# Patient Record
Sex: Female | Born: 1985 | Race: White | Hispanic: No | Marital: Married | Smoking: Never smoker
Health system: Southern US, Community
[De-identification: ages and names within clinical notes are randomized; demographics above are authoritative.]

## PROBLEM LIST (undated history)

## (undated) ENCOUNTER — Inpatient Hospital Stay (HOSPITAL_COMMUNITY): Payer: Self-pay

## (undated) DIAGNOSIS — J45909 Unspecified asthma, uncomplicated: Secondary | ICD-10-CM

## (undated) DIAGNOSIS — F53 Postpartum depression: Secondary | ICD-10-CM

## (undated) DIAGNOSIS — F329 Major depressive disorder, single episode, unspecified: Secondary | ICD-10-CM

## (undated) DIAGNOSIS — I10 Essential (primary) hypertension: Secondary | ICD-10-CM

## (undated) DIAGNOSIS — L4050 Arthropathic psoriasis, unspecified: Secondary | ICD-10-CM

## (undated) DIAGNOSIS — E039 Hypothyroidism, unspecified: Secondary | ICD-10-CM

## (undated) DIAGNOSIS — O24419 Gestational diabetes mellitus in pregnancy, unspecified control: Secondary | ICD-10-CM

## (undated) DIAGNOSIS — F419 Anxiety disorder, unspecified: Secondary | ICD-10-CM

## (undated) DIAGNOSIS — E0500 Thyrotoxicosis with diffuse goiter without thyrotoxic crisis or storm: Secondary | ICD-10-CM

## (undated) DIAGNOSIS — O99345 Other mental disorders complicating the puerperium: Secondary | ICD-10-CM

## (undated) HISTORY — DX: Hypothyroidism, unspecified: E03.9

---
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
## 2011-03-17 ENCOUNTER — Ambulatory Visit (INDEPENDENT_AMBULATORY_CARE_PROVIDER_SITE_OTHER): Payer: Commercial Managed Care - PPO

## 2011-03-17 DIAGNOSIS — J019 Acute sinusitis, unspecified: Secondary | ICD-10-CM

## 2011-03-17 DIAGNOSIS — R300 Dysuria: Secondary | ICD-10-CM

## 2011-03-17 DIAGNOSIS — J029 Acute pharyngitis, unspecified: Secondary | ICD-10-CM

## 2011-03-17 DIAGNOSIS — R509 Fever, unspecified: Secondary | ICD-10-CM

## 2011-05-19 ENCOUNTER — Ambulatory Visit (INDEPENDENT_AMBULATORY_CARE_PROVIDER_SITE_OTHER): Payer: BC Managed Care – PPO | Admitting: Physician Assistant

## 2011-05-19 VITALS — BP 127/84 | HR 66 | Temp 98.9°F | Resp 16 | Ht 64.0 in | Wt 121.0 lb

## 2011-05-19 DIAGNOSIS — M542 Cervicalgia: Secondary | ICD-10-CM

## 2011-05-19 NOTE — Progress Notes (Signed)
  Subjective:    Patient ID: [name] [name], female    DOB: 12-24-1985, 26 y.o.   MRN: [id number]  HPI [name] [name] comes in with concern about a discomfort in her right anterior neck that has been present for 1 1/2 weeks now.  It is not very painful but feels like it is her carotid pulsating.  Worried about any cardiac issue as she has a strong FHX.  She states she hasn't done any new activities that would injure her neck. No CP, SOB, dizziness, or HA. Area feels swollen and like someone is "flicking" it for a quick second.  No discomfort for long period of time.  Very episodic. Has not taken anything for this discomfort  Non smoker Nuva Ring just started in February.  Review of Systems    As above Objective:   Physical Exam  Neck: tender along Right SCM.  Pain with resistive rotation to the left. No pulsatile mass noted.  No adenopathy.  No carotid bruit bilaterally.   Chest; CTA bilaterally RRR w/out m/r/g.       Assessment & Plan:  Neck strain and muscle spasm  Reassured this is not cardiac.  Recommend heat to neck and ibuprofen.  Apply heat. Return if no improvement.

## 2014-02-05 LAB — OB RESULTS CONSOLE ABO/RH: RH TYPE: POSITIVE

## 2014-02-05 LAB — OB RESULTS CONSOLE ANTIBODY SCREEN: ANTIBODY SCREEN: NEGATIVE

## 2014-02-05 LAB — OB RESULTS CONSOLE RUBELLA ANTIBODY, IGM: Rubella: IMMUNE

## 2014-02-05 LAB — OB RESULTS CONSOLE GC/CHLAMYDIA
Chlamydia: NEGATIVE
GC PROBE AMP, GENITAL: NEGATIVE

## 2014-02-05 LAB — OB RESULTS CONSOLE HIV ANTIBODY (ROUTINE TESTING): HIV: NONREACTIVE

## 2014-02-05 LAB — OB RESULTS CONSOLE RPR: RPR: NONREACTIVE

## 2014-02-05 LAB — OB RESULTS CONSOLE HEPATITIS B SURFACE ANTIGEN: Hepatitis B Surface Ag: NEGATIVE

## 2014-03-09 DIAGNOSIS — F32A Depression, unspecified: Secondary | ICD-10-CM

## 2014-03-09 DIAGNOSIS — I10 Essential (primary) hypertension: Secondary | ICD-10-CM

## 2014-03-09 HISTORY — DX: Essential (primary) hypertension: I10

## 2014-03-09 HISTORY — DX: Depression, unspecified: F32.A

## 2014-03-09 NOTE — L&D Delivery Note (Signed)
Delivery Note At 9:51 PM a non-viable female was delivered via Vaginal, Spontaneous Delivery (Presentation: ;  ).  APGAR: 0/0, ; weight  .   Placenta status: Intact, Spontaneous.3 vessel  Cord:  with the following complications:ninviable no evidence of cord compication .  Cord pH: na  Anesthesia:   Episiotomy:  nonde Lacerations:  second Suture Repair: 2.0 chromic Est. Blood Loss (mL):    Mom to postpartum.  Baby to Couplet care / Skin to Skin.  Dorinda Stehr S 08/19/2014, 10:02 PM

## 2014-08-19 ENCOUNTER — Inpatient Hospital Stay (HOSPITAL_COMMUNITY): Payer: 59

## 2014-08-19 ENCOUNTER — Encounter (HOSPITAL_COMMUNITY): Payer: Self-pay | Admitting: Anesthesiology

## 2014-08-19 ENCOUNTER — Inpatient Hospital Stay (HOSPITAL_COMMUNITY)
Admission: AD | Admit: 2014-08-19 | Discharge: 2014-08-20 | DRG: 775 | Disposition: A | Discharge status: Home / Self Care | Payer: 59 | Source: Ambulatory Visit | Attending: Obstetrics and Gynecology | Admitting: Obstetrics and Gynecology

## 2014-08-19 ENCOUNTER — Encounter (HOSPITAL_COMMUNITY): Payer: Self-pay | Admitting: *Deleted

## 2014-08-19 DIAGNOSIS — Z8249 Family history of ischemic heart disease and other diseases of the circulatory system: Secondary | ICD-10-CM

## 2014-08-19 DIAGNOSIS — O368190 Decreased fetal movements, unspecified trimester, not applicable or unspecified: Secondary | ICD-10-CM

## 2014-08-19 DIAGNOSIS — Z3A36 36 weeks gestation of pregnancy: Secondary | ICD-10-CM | POA: Diagnosis present

## 2014-08-19 DIAGNOSIS — O368130 Decreased fetal movements, third trimester, not applicable or unspecified: Secondary | ICD-10-CM | POA: Diagnosis present

## 2014-08-19 DIAGNOSIS — O364XX0 Maternal care for intrauterine death, not applicable or unspecified: Secondary | ICD-10-CM | POA: Diagnosis present

## 2014-08-19 DIAGNOSIS — IMO0002 Reserved for concepts with insufficient information to code with codable children: Secondary | ICD-10-CM | POA: Diagnosis present

## 2014-08-19 HISTORY — DX: Unspecified asthma, uncomplicated: J45.909

## 2014-08-19 LAB — CBC
HCT: 36.2 % (ref 36.0–46.0)
HEMATOCRIT: 36.8 % (ref 36.0–46.0)
HEMOGLOBIN: 12.2 g/dL (ref 12.0–15.0)
Hemoglobin: 12 g/dL (ref 12.0–15.0)
MCH: 29.4 pg (ref 26.0–34.0)
MCH: 29.6 pg (ref 26.0–34.0)
MCHC: 33.2 g/dL (ref 30.0–36.0)
MCHC: 33.1 g/dL (ref 30.0–36.0)
MCV: 88.7 fL (ref 78.0–100.0)
MCV: 89.2 fL (ref 78.0–100.0)
PLATELETS: 207 10*3/uL (ref 150–400)
Platelets: 207 10*3/uL (ref 150–400)
RBC: 4.15 MIL/uL (ref 3.87–5.11)
RBC: 4.06 MIL/uL (ref 3.87–5.11)
RDW: 14.7 % (ref 11.5–15.5)
RDW: 15.2 % (ref 11.5–15.5)
WBC: 9.8 10*3/uL (ref 4.0–10.5)
WBC: 15.2 10*3/uL — AB (ref 4.0–10.5)

## 2014-08-19 LAB — COMPREHENSIVE METABOLIC PANEL
ALT: 18 U/L (ref 14–54)
AST: 29 U/L (ref 15–41)
Albumin: 2.6 g/dL — ABNORMAL LOW (ref 3.5–5.0)
Alkaline Phosphatase: 240 U/L — ABNORMAL HIGH (ref 38–126)
Anion gap: 6 (ref 5–15)
BUN: 8 mg/dL (ref 6–20)
CO2: 22 mmol/L (ref 22–32)
Calcium: 8.5 mg/dL — ABNORMAL LOW (ref 8.9–10.3)
Chloride: 106 mmol/L (ref 101–111)
Creatinine, Ser: 0.7 mg/dL (ref 0.44–1.00)
GFR calc Af Amer: >60 mL/min (ref >60)
GFR calc non Af Amer: >60 mL/min (ref >60)
GLUCOSE: 106 mg/dL — AB (ref 65–99)
Potassium: 3.8 mmol/L (ref 3.5–5.1)
Sodium: 134 mmol/L — ABNORMAL LOW (ref 135–145)
Total Bilirubin: 0.5 mg/dL (ref 0.3–1.2)
Total Protein: 5.8 g/dL — ABNORMAL LOW (ref 6.5–8.1)

## 2014-08-19 LAB — TYPE AND SCREEN
ABO/RH(D): O POS
ANTIBODY SCREEN: NEGATIVE

## 2014-08-19 LAB — ABO/RH: ABO/RH(D): O POS

## 2014-08-19 LAB — RPR: RPR: NONREACTIVE

## 2014-08-19 MED ORDER — ACETAMINOPHEN 325 MG PO TABS
650.0000 mg | ORAL_TABLET | ORAL | Status: DC | PRN
  Administered 2014-08-19: 650 mg via ORAL
  Filled 2014-08-19: qty 2

## 2014-08-19 MED ORDER — FLEET ENEMA 7-19 GM/118ML RE ENEM: 1.0000 | ENEMA | RECTAL | Status: DC | PRN

## 2014-08-19 MED ORDER — OXYTOCIN BOLUS FROM INFUSION: 500.0000 mL | INTRAVENOUS | Status: DC

## 2014-08-19 MED ORDER — CITRIC ACID-SODIUM CITRATE 334-500 MG/5ML PO SOLN: 30.0000 mL | ORAL | Status: DC | PRN

## 2014-08-19 MED ORDER — OXYTOCIN 40 UNITS IN LACTATED RINGERS INFUSION - SIMPLE MED
62.5000 mL/h | INTRAVENOUS | Status: DC
Start: 2014-08-19 — End: 2014-08-20
  Administered 2014-08-19: 62.5 mL/h via INTRAVENOUS
  Filled 2014-08-19: qty 1000

## 2014-08-19 MED ORDER — LIDOCAINE HCL (PF) 1 % IJ SOLN
30.0000 mL | INTRAMUSCULAR | Status: DC | PRN
  Administered 2014-08-19: 30 mL via SUBCUTANEOUS
  Filled 2014-08-19: qty 30

## 2014-08-19 MED ORDER — LABETALOL HCL 5 MG/ML IV SOLN
10.0000 mg | Freq: Once | INTRAVENOUS | Status: AC
  Administered 2014-08-19: 10 mg via INTRAVENOUS

## 2014-08-19 MED ORDER — MISOPROSTOL 50MCG HALF TABLET
50.0000 ug | ORAL_TABLET | ORAL | Status: DC | PRN
  Administered 2014-08-19: 50 ug via ORAL
  Filled 2014-08-19 (×2): qty 0.5

## 2014-08-19 MED ORDER — FENTANYL CITRATE (PF) 100 MCG/2ML IJ SOLN
100.0000 ug | INTRAMUSCULAR | Status: DC | PRN
  Administered 2014-08-19 (×3): 100 ug via INTRAVENOUS
  Filled 2014-08-19 (×3): qty 2

## 2014-08-19 MED ORDER — EPHEDRINE 5 MG/ML INJ: 10.0000 mg | INTRAVENOUS | Status: DC | PRN

## 2014-08-19 MED ORDER — OXYCODONE-ACETAMINOPHEN 5-325 MG PO TABS
1.0000 | ORAL_TABLET | ORAL | Status: DC | PRN
Start: 2014-08-19 — End: 2014-08-20

## 2014-08-19 MED ORDER — FENTANYL 2.5 MCG/ML BUPIVACAINE 1/10 % EPIDURAL INFUSION (WH - ANES)
INTRAMUSCULAR | Status: AC
  Filled 2014-08-19: qty 125

## 2014-08-19 MED ORDER — LABETALOL HCL 5 MG/ML IV SOLN
INTRAVENOUS | Status: AC
  Administered 2014-08-19: 10 mg via INTRAVENOUS
  Filled 2014-08-19: qty 4

## 2014-08-19 MED ORDER — LACTATED RINGERS IV SOLN
INTRAVENOUS | Status: DC
  Administered 2014-08-19 (×2): via INTRAVENOUS

## 2014-08-19 MED ORDER — MISOPROSTOL 25 MCG QUARTER TABLET
25.0000 ug | ORAL_TABLET | ORAL | Status: DC | PRN
  Administered 2014-08-19: 25 ug via VAGINAL
  Filled 2014-08-19: qty 0.25

## 2014-08-19 MED ORDER — TERBUTALINE SULFATE 1 MG/ML IJ SOLN: 0.2500 mg | Freq: Once | INTRAMUSCULAR | Status: AC | PRN

## 2014-08-19 MED ORDER — LORAZEPAM 2 MG/ML IJ SOLN: 1.0000 mg | INTRAMUSCULAR | Status: DC | PRN

## 2014-08-19 MED ORDER — DIPHENHYDRAMINE HCL 50 MG/ML IJ SOLN: 12.5000 mg | INTRAMUSCULAR | Status: DC | PRN

## 2014-08-19 MED ORDER — PHENYLEPHRINE 40 MCG/ML (10ML) SYRINGE FOR IV PUSH (FOR BLOOD PRESSURE SUPPORT): 80.0000 ug | PREFILLED_SYRINGE | INTRAVENOUS | Status: DC | PRN

## 2014-08-19 MED ORDER — LACTATED RINGERS IV SOLN
500.0000 mL | INTRAVENOUS | Status: DC | PRN
  Administered 2014-08-19: 500 mL via INTRAVENOUS

## 2014-08-19 MED ORDER — MISOPROSTOL 50MCG HALF TABLET
50.0000 ug | ORAL_TABLET | ORAL | Status: DC | PRN
  Administered 2014-08-19 (×2): 50 ug via ORAL
  Filled 2014-08-19 (×2): qty 0.5

## 2014-08-19 MED ORDER — HYDRALAZINE HCL 20 MG/ML IJ SOLN
10.0000 mg | INTRAMUSCULAR | Status: DC | PRN
Start: 2014-08-19 — End: 2014-08-20
  Administered 2014-08-19 (×2): 10 mg via INTRAVENOUS
  Filled 2014-08-19 (×2): qty 1

## 2014-08-19 MED ORDER — PHENYLEPHRINE 40 MCG/ML (10ML) SYRINGE FOR IV PUSH (FOR BLOOD PRESSURE SUPPORT)
PREFILLED_SYRINGE | INTRAVENOUS | Status: AC
  Filled 2014-08-19: qty 20

## 2014-08-19 MED ORDER — ONDANSETRON HCL 4 MG/2ML IJ SOLN
4.0000 mg | Freq: Four times a day (QID) | INTRAMUSCULAR | Status: DC | PRN
  Administered 2014-08-19: 4 mg via INTRAVENOUS
  Filled 2014-08-19: qty 2

## 2014-08-19 MED ORDER — FENTANYL 2.5 MCG/ML BUPIVACAINE 1/10 % EPIDURAL INFUSION (WH - ANES): 14.0000 mL/h | INTRAMUSCULAR | Status: DC | PRN

## 2014-08-19 MED ORDER — OXYCODONE-ACETAMINOPHEN 5-325 MG PO TABS: 2.0000 | ORAL_TABLET | ORAL | Status: DC | PRN

## 2014-08-19 NOTE — Progress Notes (Signed)
Hot and  Flushed after IV labetolol dose given. VSS. Fan given for comfort

## 2014-08-19 NOTE — MAU Note (Signed)
Report called to [facility] in [facility].

## 2014-08-19 NOTE — Plan of Care (Signed)
Problem: Phase II Progression Outcomes Goal: Initiate breastfeeding within 1hr delivery Outcome: Not Applicable Date Met:  03/47/42 IUFD

## 2014-08-19 NOTE — Plan of Care (Signed)
Problem: Phase II Progression Outcomes Goal: Fetal monitoring per orders Outcome: Not Applicable Date Met:  94/70/76 IUFD

## 2014-08-19 NOTE — MAU Note (Signed)
Decreased FM on Saturday. Denies LOF or bleeding.

## 2014-08-19 NOTE — Progress Notes (Signed)
Patient ID: [name] [name], female   DOB: Oct 05, 1985, 29 y.o.   MRN: [id number] Cervix 4 cm arom clear continue cytotec

## 2014-08-19 NOTE — H&P (Signed)
[name] [name] is a 29 y.o. female presenting at 36 weeks and 4 days with decreased fetal movement. It'll heart tones were not audible. Subsequent ultrasound confirmed intrauterine fetal demise. To be admitted for Cytotec induction. Maternal Medical History:  Prenatal complications: no prenatal complications Prenatal Complications - Diabetes: none.    OB History    Gravida Para Term Preterm AB TAB SAB Ectopic Multiple Living   1              Past Medical History  Diagnosis Date  . Medical history non-contributory    Past Surgical History  Procedure Laterality Date  . No past surgeries    . Wisdom tooth extraction     Family History: family history includes Cancer in her maternal grandmother; Heart disease in her maternal grandmother. Social History:  reports that she has never smoked. She does not have any smokeless tobacco history on file. She reports that she does not drink alcohol or use illicit drugs.   Prenatal Transfer Tool  Maternal Diabetes: No Genetic Screening: Normal Maternal Ultrasounds/Referrals: Normal Fetal Ultrasounds or other Referrals:  None Maternal Substance Abuse:  No Significant Maternal Medications:  None Significant Maternal Lab Results:  None Other Comments:  None  ROS    Blood pressure 140/94, pulse 74, temperature 97.7 F (36.5 C), resp. rate 20, height 5\' 3"  (1.6 m), weight 71.396 kg (157 lb 6.4 oz). Maternal Exam:  Abdomen: Patient reports no abdominal tenderness. Fundal height is c/w dates.       Physical Exam  Constitutional: She is oriented to person, place, and time.  Cardiovascular: Normal rate, regular rhythm and normal heart sounds.   Respiratory: Effort normal and breath sounds normal.  Neurological: She is alert and oriented to person, place, and time. She has normal reflexes.    Prenatal labs: ABO, Rh:   Antibody:   Rubella:   RPR:    HBsAg:    HIV:    GBS:     Assessment/Plan: Intrauterine fetal demise at 90 weeks  and 4 days. Patient be brought in for Cytotec induction and delivery. Questions were answered.   Christophere Hillhouse S 08/19/2014, 3:10 AM

## 2014-08-19 NOTE — MAU Note (Signed)
Ledell Noss PA in to see pt.

## 2014-08-19 NOTE — MAU Provider Note (Signed)
  History     CSN: [id number]  Arrival date and time: 08/19/14 0130   First Provider Initiated Contact with Patient 08/19/14 [phone]      Chief Complaint  Patient presents with  . Decreased Fetal Movement   HPI Jayelle B Myszka 29 y.o. G1P0 @[redacted]w[redacted]d  presents with decreased fetal movement.  2 days ago it was slightly decreased.  Yesterday, more significantly decreased. No movement noted in the last 8 hours.  Baby usually moves more at bedtime but that has happened today.  Pt has eaten and no irregular activity.  Was seen in clinic earlier this week, 6/7, had BPP that was 8/8.   Denies LOF, VB, CP, SOB.  Feeling very anxious and upset.   OB History    Gravida Para Term Preterm AB TAB SAB Ectopic Multiple Living   1               Past Medical History  Diagnosis Date  . Medical history non-contributory     Past Surgical History  Procedure Laterality Date  . No past surgeries    . Wisdom tooth extraction      Family History  Problem Relation Age of Onset  . Cancer Maternal Grandmother   . Heart disease Maternal Grandmother     History  Substance Use Topics  . Smoking status: Never Smoker   . Smokeless tobacco: Not on file  . Alcohol Use: No    Allergies: No Known Allergies  Prescriptions prior to admission  Medication Sig Dispense Refill Last Dose  . Prenatal Vit-Fe Fumarate-FA (PRENATAL MULTIVITAMIN) TABS tablet Take 1 tablet by mouth daily at 12 noon.   08/18/2014 at Unknown time  . etonogestrel-ethinyl estradiol (NUVARING) 0.12-0.015 MG/24HR vaginal ring Place 1 each vaginally every 28 (twenty-eight) days. Insert vaginally and leave in place for 3 consecutive weeks, then remove for 1 week.       ROS Pertinent ROS in HPI.  All other systems are negative.   Physical Exam   Blood pressure 140/94, pulse 74, temperature 97.7 F (36.5 C), resp. rate 20, height 5\' 3"  (1.6 m), weight 157 lb 6.4 oz (71.396 kg).  Physical Exam  Constitutional: She is oriented to person,  place, and time. She appears well-developed and well-nourished.  HENT:  Head: Normocephalic and atraumatic.  Eyes: EOM are normal.  Neck: Normal range of motion.  Cardiovascular: Normal rate.   Respiratory: Effort normal. No respiratory distress.  GI: Soft. She exhibits no distension. There is no tenderness.  Musculoskeletal: Normal range of motion.  Neurological: She is alert and oriented to person, place, and time.  Skin: Skin is warm and dry.  Psychiatric: She has a normal mood and affect.    MAU Course  Procedures  MDM Bedside u/s ordered upon hearing of nurse unable to get heart tones.  Brief assessment performed.  Dr. Radene Knee informed nurse unable to get heart tones, Bedside U/S ordered.   U/S tech indicates no cardiac activity.  Dr. Radene Knee informed of this prelim result.  He indicates he will be down to see pt and assume care.    Assessment and Plan    Teague Bobbye Morton 08/19/2014, 2:19 AM

## 2014-08-19 NOTE — MAU Note (Signed)
Allie Dimmer PA on unit. Aware unable to hear FHTs . Will see pt

## 2014-08-19 NOTE — Progress Notes (Signed)
Bedside u/s to evaluate fetal cardiac activity

## 2014-08-19 NOTE — Progress Notes (Signed)
Dr Radene Knee on phone - aware that cytotec not given - uc's 1-2 min apart - also let him know that fentanyl iv was given for pain - stated that he was home and to give him a few minutes to get here

## 2014-08-19 NOTE — Progress Notes (Signed)
[name] [name] was lying in bed, visibly upset but appropriately tearful. Her husband, mother, and another relative were bedside. She said she is doing all she can (to deal with what is going on). She was very tearful; and during points of our visit her husband became tearful as well. Other family members were appropriately tearful with her. We talked about the support she is fortunate to have during this difficult day. Pt showed me gifts for baby from her shower; a blanket and dolls that she was holding close by with monograms. The name given to her daughter is Rory Percy.  She asked me to pray for her; her family joined bedside during prayer.  We talked briefly about how or if she may want Chaplain support throughout the day and after the birth of her child. Will refer [name] to daytime Chaplain for follow-up. Please page if any additional support is needed during the day or night. Ernest Haber Chaplain   08/19/14 1015  Clinical Encounter Type  Visited With Patient and family together  Visit Type Initial;Other (Comment)  Referral From Nurse  Consult/Referral To Chaplain

## 2014-08-19 NOTE — Plan of Care (Signed)
Problem: Phase I Progression Outcomes Goal: FHR checked 5 minutes after meds (ROM) Rupture of Membranes Outcome: Not Applicable Date Met:  70/65/82 IUFD

## 2014-08-19 NOTE — Progress Notes (Signed)
Family with pt and pt's spouse and very supportive

## 2014-08-20 DIAGNOSIS — IMO0002 Reserved for concepts with insufficient information to code with codable children: Secondary | ICD-10-CM | POA: Diagnosis present

## 2014-08-20 LAB — CBC
HCT: 32.8 % — ABNORMAL LOW (ref 36.0–46.0)
HEMOGLOBIN: 10.7 g/dL — AB (ref 12.0–15.0)
MCH: 29.3 pg (ref 26.0–34.0)
MCHC: 32.6 g/dL (ref 30.0–36.0)
MCV: 89.9 fL (ref 78.0–100.0)
Platelets: 192 10*3/uL (ref 150–400)
RBC: 3.65 MIL/uL — ABNORMAL LOW (ref 3.87–5.11)
RDW: 15.4 % (ref 11.5–15.5)
WBC: 16 10*3/uL — ABNORMAL HIGH (ref 4.0–10.5)

## 2014-08-20 MED ORDER — PRENATAL MULTIVITAMIN CH: 1.0000 | ORAL_TABLET | Freq: Every day | ORAL | Status: DC

## 2014-08-20 MED ORDER — ONDANSETRON HCL 4 MG PO TABS: 4.0000 mg | ORAL_TABLET | ORAL | Status: DC | PRN

## 2014-08-20 MED ORDER — ACETAMINOPHEN 325 MG PO TABS: 650.0000 mg | ORAL_TABLET | ORAL | Status: DC | PRN

## 2014-08-20 MED ORDER — WITCH HAZEL-GLYCERIN EX PADS: 1.0000 "application " | MEDICATED_PAD | CUTANEOUS | Status: DC | PRN

## 2014-08-20 MED ORDER — BISACODYL 10 MG RE SUPP
10.0000 mg | Freq: Every day | RECTAL | Status: DC | PRN
  Filled 2014-08-20: qty 1

## 2014-08-20 MED ORDER — SIMETHICONE 80 MG PO CHEW: 80.0000 mg | CHEWABLE_TABLET | ORAL | Status: DC | PRN

## 2014-08-20 MED ORDER — DIPHENHYDRAMINE HCL 25 MG PO CAPS: 25.0000 mg | ORAL_CAPSULE | Freq: Four times a day (QID) | ORAL | Status: DC | PRN

## 2014-08-20 MED ORDER — ZOLPIDEM TARTRATE 5 MG PO TABS: 5.0000 mg | ORAL_TABLET | Freq: Every evening | ORAL | Status: DC | PRN

## 2014-08-20 MED ORDER — DOCUSATE SODIUM 100 MG PO CAPS: 100.0000 mg | ORAL_CAPSULE | Freq: Two times a day (BID) | ORAL | Status: DC

## 2014-08-20 MED ORDER — SERTRALINE HCL 25 MG PO TABS: 25.0000 mg | ORAL_TABLET | Freq: Every day | ORAL | Status: DC

## 2014-08-20 MED ORDER — LANOLIN HYDROUS EX OINT: TOPICAL_OINTMENT | CUTANEOUS | Status: DC | PRN

## 2014-08-20 MED ORDER — FLEET ENEMA 7-19 GM/118ML RE ENEM: 1.0000 | ENEMA | Freq: Every day | RECTAL | Status: DC | PRN

## 2014-08-20 MED ORDER — OXYCODONE-ACETAMINOPHEN 5-325 MG PO TABS
1.0000 | ORAL_TABLET | ORAL | Status: DC | PRN
Start: 2014-08-20 — End: 2014-08-20

## 2014-08-20 MED ORDER — SENNOSIDES-DOCUSATE SODIUM 8.6-50 MG PO TABS: 2.0000 | ORAL_TABLET | ORAL | Status: DC

## 2014-08-20 MED ORDER — DIBUCAINE 1 % RE OINT
1.0000 "application " | TOPICAL_OINTMENT | RECTAL | Status: DC | PRN
  Filled 2014-08-20: qty 28

## 2014-08-20 MED ORDER — IBUPROFEN 600 MG PO TABS: 600.0000 mg | ORAL_TABLET | Freq: Four times a day (QID) | ORAL | Status: DC

## 2014-08-20 MED ORDER — BENZOCAINE-MENTHOL 20-0.5 % EX AERO
1.0000 "application " | INHALATION_SPRAY | CUTANEOUS | Status: DC | PRN
  Administered 2014-08-20: 1 via TOPICAL
  Filled 2014-08-20 (×2): qty 56

## 2014-08-20 MED ORDER — IBUPROFEN 600 MG PO TABS
600.0000 mg | ORAL_TABLET | Freq: Four times a day (QID) | ORAL | Status: DC
  Administered 2014-08-20 (×2): 600 mg via ORAL
  Filled 2014-08-20 (×2): qty 1

## 2014-08-20 MED ORDER — ONDANSETRON HCL 4 MG/2ML IJ SOLN: 4.0000 mg | INTRAMUSCULAR | Status: DC | PRN

## 2014-08-20 MED ORDER — TETANUS-DIPHTH-ACELL PERTUSSIS 5-2.5-18.5 LF-MCG/0.5 IM SUSP
0.5000 mL | Freq: Once | INTRAMUSCULAR | Status: DC
  Filled 2014-08-20: qty 0.5

## 2014-08-20 MED ORDER — SERTRALINE HCL 25 MG PO TABS
25.0000 mg | ORAL_TABLET | Freq: Every day | ORAL | Status: DC
  Administered 2014-08-20: 25 mg via ORAL
  Filled 2014-08-20 (×2): qty 1

## 2014-08-20 MED ORDER — OXYCODONE-ACETAMINOPHEN 5-325 MG PO TABS: 2.0000 | ORAL_TABLET | ORAL | Status: DC | PRN

## 2014-08-20 NOTE — Progress Notes (Signed)
Post Partum Day 1 Subjective: no complaints, up ad lib, voiding, tolerating PO and affect appropriate, tearful  Objective: Blood pressure 111/73, pulse 83, temperature 98.7 F (37.1 C), temperature source Oral, resp. rate 18, height 5\' 3"  (1.6 m), weight 157 lb 6.4 oz (71.396 kg), SpO2 96 %.  Physical Exam:  General: alert and cooperative Lochia: appropriate Uterine Fundus: firm Incision: healing well DVT Evaluation: No evidence of DVT seen on physical exam. Negative Homan's sign. No cords or calf tenderness. No significant calf/ankle edema.   Recent Labs  08/19/14 2115 08/20/14 0505  HGB 12.0 10.7*  HCT 36.2 32.8*    Assessment/Plan: Plan for discharge tomorrow, plan to start Zoloft   LOS: 1 day   [name] [name] G 08/20/2014, 8:29 AM

## 2014-08-20 NOTE — Progress Notes (Signed)
Met with patient and spouse and then father. Patient and spouse grieving the loss of [name] [name]. Provided grief support through word, presence, and materials. Helped them process this loss and find ways to cope. Listening; presence; prayer for patient and spouse, [name] [name]. Then spoke with father in hallway; provided support.  [name] Gore, PhD, [place]

## 2014-08-20 NOTE — Progress Notes (Signed)
Pt discharged to home with husband, Herbie Baltimore.  They have family support from both their families, have information about Heartstrings support group, and have met with a Nurse, learning disability to make arrangements for their baby.  Pt ambulated to car with RN.  No equipment for home ordered at discharge.

## 2014-08-20 NOTE — Progress Notes (Signed)
Post Partum Day 1 Subjective: no complaints, up ad lib, voiding and tolerating PO.  Feeling sad and processing everything.  Objective: Blood pressure 111/73, pulse 83, temperature 98.7 F (37.1 C), temperature source Oral, resp. rate 18, height 5\' 3"  (1.6 m), weight 157 lb 6.4 oz (71.396 kg), SpO2 96 %.  Physical Exam:  General: alert, cooperative and appears stated age.  Appropriate. Lochia: appropriate Uterine Fundus: firm Incision: healing well, no significant drainage, no dehiscence DVT Evaluation: No evidence of DVT seen on physical exam. Negative Homan's sign. No cords or calf tenderness.   Recent Labs  08/19/14 2115 08/20/14 0505  HGB 12.0 10.7*  HCT 36.2 32.8*    Assessment/Plan: Discharge home today.  Patient wants to stay until her baby is released to the funeral home.   Patient declines autopsy as well genetics.  Patient is counseled that in light of elevated risk of DS with first tri screen (normal NIPT) chromosomal studies may give [place] a possible cause.  Patient refuses testing.   Plan to have pt return to office in 1 week Will start SSRI per pt request   LOS: 1 day   [name] [name], [place] 08/20/2014, 10:03 AM

## 2014-08-20 NOTE — Discharge Instructions (Signed)
Call MD for T>100.4, heavy vaginal bleeding, severe abdominal pain, intractable nausea and/or vomiting, or respiratory distress.  Office will call you to schedule appointment in 1 week.  Pelvic rest x 6 weeks.

## 2014-08-20 NOTE — Discharge Summary (Signed)
Obstetric Discharge Summary Reason for Admission: decreased fetal movement; IUFD Prenatal Procedures: ultrasound Intrapartum Procedures: spontaneous vaginal delivery Postpartum Procedures: none Complications-Operative and Postpartum: none HEMOGLOBIN  Date Value Ref Range Status  08/20/2014 10.7* 12.0 - 15.0 g/dL Final   HCT  Date Value Ref Range Status  08/20/2014 32.8* 36.0 - 46.0 % Final    Physical Exam:  General: alert, cooperative and appears stated age 29: appropriate Uterine Fundus: firm Incision: healing well, no significant drainage, no dehiscence DVT Evaluation: No evidence of DVT seen on physical exam. Negative Homan's sign. No cords or calf tenderness.  Discharge Diagnoses: Term Pregnancy-delivered  Discharge Information: Date: 08/20/2014 Activity: pelvic rest Diet: routine Medications: PNV, Ibuprofen and Colace Condition: stable Instructions: refer to practice specific booklet Discharge to: home   Newborn Data: Live born female  Birth Weight: 5 lb 5 oz (2410 g) APGAR: 0, 0  Home with funeral home.  Ryne Mctigue, [place] 08/20/2014, 2:13 PM

## 2014-10-10 ENCOUNTER — Other Ambulatory Visit: Payer: Self-pay | Admitting: Obstetrics & Gynecology

## 2014-10-11 LAB — CYTOLOGY - PAP

## 2014-10-15 ENCOUNTER — Other Ambulatory Visit: Payer: Self-pay

## 2014-10-15 ENCOUNTER — Emergency Department (HOSPITAL_COMMUNITY): Payer: Commercial Managed Care - PPO

## 2014-10-15 ENCOUNTER — Emergency Department (HOSPITAL_COMMUNITY)
Admission: EM | Admit: 2014-10-15 | Discharge: 2014-10-15 | Disposition: A | Discharge status: Home / Self Care | Payer: Commercial Managed Care - PPO | Attending: Emergency Medicine | Admitting: Emergency Medicine

## 2014-10-15 ENCOUNTER — Encounter (HOSPITAL_COMMUNITY): Payer: Self-pay | Admitting: Emergency Medicine

## 2014-10-15 DIAGNOSIS — Z3202 Encounter for pregnancy test, result negative: Secondary | ICD-10-CM | POA: Diagnosis not present

## 2014-10-15 DIAGNOSIS — R002 Palpitations: Secondary | ICD-10-CM | POA: Diagnosis present

## 2014-10-15 DIAGNOSIS — E0590 Thyrotoxicosis, unspecified without thyrotoxic crisis or storm: Secondary | ICD-10-CM

## 2014-10-15 DIAGNOSIS — J45909 Unspecified asthma, uncomplicated: Secondary | ICD-10-CM | POA: Diagnosis not present

## 2014-10-15 DIAGNOSIS — Z792 Long term (current) use of antibiotics: Secondary | ICD-10-CM | POA: Diagnosis not present

## 2014-10-15 DIAGNOSIS — R000 Tachycardia, unspecified: Secondary | ICD-10-CM | POA: Insufficient documentation

## 2014-10-15 LAB — CBC WITH DIFFERENTIAL/PLATELET
Basophils Absolute: 0 10*3/uL (ref 0.0–0.1)
Basophils Relative: 0 % (ref 0–1)
Eosinophils Absolute: 0.1 10*3/uL (ref 0.0–0.7)
Eosinophils Relative: 3 % (ref 0–5)
HEMATOCRIT: 42.3 % (ref 36.0–46.0)
Hemoglobin: 14 g/dL (ref 12.0–15.0)
LYMPHS PCT: 46 % (ref 12–46)
Lymphs Abs: 2 10*3/uL (ref 0.7–4.0)
MCH: 30.2 pg (ref 26.0–34.0)
MCHC: 33.1 g/dL (ref 30.0–36.0)
MCV: 91.4 fL (ref 78.0–100.0)
Monocytes Absolute: 0.5 10*3/uL (ref 0.1–1.0)
Monocytes Relative: 12 % (ref 3–12)
NEUTROS ABS: 1.7 10*3/uL (ref 1.7–7.7)
Neutrophils Relative %: 39 % — ABNORMAL LOW (ref 43–77)
Platelets: 352 10*3/uL (ref 150–400)
RBC: 4.63 MIL/uL (ref 3.87–5.11)
RDW: 12.3 % (ref 11.5–15.5)
WBC: 4.3 10*3/uL (ref 4.0–10.5)

## 2014-10-15 LAB — BASIC METABOLIC PANEL
ANION GAP: 9 (ref 5–15)
BUN: 13 mg/dL (ref 6–20)
CALCIUM: 9.8 mg/dL (ref 8.9–10.3)
CO2: 26 mmol/L (ref 22–32)
Chloride: 102 mmol/L (ref 101–111)
Creatinine, Ser: 0.53 mg/dL (ref 0.44–1.00)
GFR calc Af Amer: >60 mL/min (ref >60)
GFR calc non Af Amer: >60 mL/min (ref >60)
Glucose, Bld: 126 mg/dL — ABNORMAL HIGH (ref 65–99)
Potassium: 4 mmol/L (ref 3.5–5.1)
Sodium: 137 mmol/L (ref 135–145)

## 2014-10-15 LAB — I-STAT TROPONIN, ED: Troponin i, poc: 0 ng/mL (ref 0.00–0.08)

## 2014-10-15 LAB — POC URINE PREG, ED: PREG TEST UR: NEGATIVE

## 2014-10-15 MED ORDER — METOPROLOL TARTRATE 25 MG PO TABS: 25.0000 mg | ORAL_TABLET | Freq: Two times a day (BID) | ORAL | Status: DC

## 2014-10-15 MED ORDER — METOPROLOL TARTRATE 25 MG PO TABS: 100.0000 mg | ORAL_TABLET | Freq: Two times a day (BID) | ORAL | Status: DC

## 2014-10-15 MED ORDER — SODIUM CHLORIDE 0.9 % IV SOLN: INTRAVENOUS | Status: DC

## 2014-10-15 NOTE — Discharge Instructions (Signed)
Hyperthyroidism The thyroid is a large gland located in the lower front part of your neck. The thyroid helps control metabolism. Metabolism is how your body uses food. It controls metabolism with the hormone thyroxine. When the thyroid is overactive, it produces too much hormone. When this happens, these following problems may occur:   Nervousness  Heat intolerance  Weight loss (in spite of increase food intake)  Diarrhea  Change in hair or skin texture  Palpitations (heart skipping or having extra beats)  Tachycardia (rapid heart rate)  Loss of menstruation (amenorrhea)  Shaking of the hands CAUSES  Grave's Disease (the immune system attacks the thyroid gland). This is the most common cause.  Inflammation of the thyroid gland.  Tumor (usually benign) in the thyroid gland or elsewhere.  Excessive use of thyroid medications (both prescription and 'natural').  Excessive ingestion of Iodine. DIAGNOSIS  To prove hyperthyroidism, your caregiver may do blood tests and ultrasound tests. Sometimes the signs are hidden. It may be necessary for your caregiver to watch this illness with blood tests, either before or after diagnosis and treatment. TREATMENT Short-term treatment There are several treatments to control symptoms. Drugs called beta blockers may give some relief. Drugs that decrease hormone production will provide temporary relief in many people. These measures will usually not give permanent relief. Definitive therapy There are treatments available which can be discussed between you and your caregiver which will permanently treat the problem. These treatments range from surgery (removal of the thyroid), to the use of radioactive iodine (destroys the thyroid by radiation), to the use of antithyroid drugs (interfere with hormone synthesis). The first two treatments are permanent and usually successful. They most often require hormone replacement therapy for life. This is because  it is impossible to remove or destroy the exact amount of thyroid required to make a person euthyroid (normal). HOME CARE INSTRUCTIONS  See your caregiver if the problems you are being treated for get worse. Examples of this would be the problems listed above. SEEK MEDICAL CARE IF: Your general condition worsens. MAKE SURE YOU:   Understand these instructions.  Will watch your condition.  Will get help right away if you are not doing well or get worse. Document Released: 02/23/2005 Document Revised: 05/18/2011 Document Reviewed: 07/07/2006 [facility] Patient Information 2015 [place], [place]. This information is not intended to replace advice given to you by your health care provider. Make sure you discuss any questions you have with your health care provider. Metoprolol tablets What is this medicine? METOPROLOL (me TOE proe lole) is a beta-blocker. Beta-blockers reduce the workload on the heart and help it to beat more regularly. This medicine is used to treat high blood pressure and to prevent chest pain. It is also used to after a heart attack and to prevent an additional heart attack from occurring. This medicine may be used for other purposes; ask your health care provider or pharmacist if you have questions. COMMON BRAND NAME(S): Lopressor What should I tell my health care provider before I take this medicine? They need to know if you have any of these conditions: -diabetes -heart or vessel disease like slow heart rate, worsening heart failure, heart block, sick sinus syndrome or Raynaud's disease -kidney disease -liver disease -lung or breathing disease, like asthma or emphysema -pheochromocytoma -thyroid disease -an unusual or allergic reaction to metoprolol, other beta-blockers, medicines, foods, dyes, or preservatives -pregnant or trying to get pregnant -breast-feeding How should I use this medicine? Take this medicine by mouth with a  drink of water. Follow the directions on  the prescription label. Take this medicine immediately after meals. Take your doses at regular intervals. Do not take more medicine than directed. Do not stop taking this medicine suddenly. This could lead to serious heart-related effects. Talk to your pediatrician regarding the use of this medicine in children. Special care may be needed. Overdosage: If you think you have taken too much of this medicine contact a poison control center or emergency room at once. NOTE: This medicine is only for you. Do not share this medicine with others. What if I miss a dose? If you miss a dose, take it as soon as you can. If it is almost time for your next dose, take only that dose. Do not take double or extra doses. What may interact with this medicine? This medicine may interact with the following medications: -certain medicines for blood pressure, heart disease, irregular heart beat -certain medicines for depression like monoamine oxidase (MAO) inhibitors, fluoxetine, or paroxetine -clonidine -dobutamine -epinephrine -isoproterenol -reserpine This list may not describe all possible interactions. Give your health care provider a list of all the medicines, herbs, non-prescription drugs, or dietary supplements you use. Also tell them if you smoke, drink alcohol, or use illegal drugs. Some items may interact with your medicine. What should I watch for while using this medicine? Visit your doctor or health care professional for regular check ups. Contact your doctor right away if your symptoms worsen. Check your blood pressure and pulse rate regularly. Ask your health care professional what your blood pressure and pulse rate should be, and when you should contact them. You may get drowsy or dizzy. Do not drive, use machinery, or do anything that needs mental alertness until you know how this medicine affects you. Do not sit or stand up quickly, especially if you are an older patient. This reduces the risk of dizzy  or fainting spells. Contact your doctor if these symptoms continue. Alcohol may interfere with the effect of this medicine. Avoid alcoholic drinks. What side effects may I notice from receiving this medicine? Side effects that you should report to your doctor or health care professional as soon as possible: -allergic reactions like skin rash, itching or hives -cold or numb hands or feet -depression -difficulty breathing -faint -fever with sore throat -irregular heartbeat, chest pain -rapid weight gain -swollen legs or ankles Side effects that usually do not require medical attention (report to your doctor or health care professional if they continue or are bothersome): -anxiety or nervousness -change in sex drive or performance -dry skin -headache -nightmares or trouble sleeping -short term memory loss -stomach upset or diarrhea -unusually tired This list may not describe all possible side effects. Call your doctor for medical advice about side effects. You may report side effects to FDA at 1-800-FDA-1088. Where should I keep my medicine? Keep out of the reach of children. Store at room temperature between 15 and 30 degrees C (59 and 86 degrees F). Throw away any unused medicine after the expiration date. NOTE: This sheet is a summary. It may not cover all possible information. If you have questions about this medicine, talk to your doctor, pharmacist, or health care provider.  2015, [employer]vier/[employer] Standard. (2012-10-28 14:40:36)

## 2014-10-15 NOTE — ED Provider Notes (Signed)
CSN: [id number]     Arrival date & time 10/15/14  1229 History   First MD Initiated Contact with Patient 10/15/14 1318     Chief Complaint  Patient presents with  . Palpitations   HPI Pt had a post partum visit in June.  She had blood testing after her visit.  She was called and was told that she was hyperthyroid.  She was told that her TSH was undetectable.  She has been waiting for an appointment but has not received one yet.  Last night she felt her heart racing.  She was having trouble with her breathing as well so she came to the ED today.  She does feel a little better now.  No fevers.  No swelling.   Dr Linda Hedges.  PCP [facility]. Past Medical History  Diagnosis Date  . Asthma   . Panic anxiety syndrome    Past Surgical History  Procedure Laterality Date  . No past surgeries    . Wisdom tooth extraction     Family History  Problem Relation Age of Onset  . Cancer Maternal Grandmother   . Heart disease Maternal Grandmother    History  Substance Use Topics  . Smoking status: Never Smoker   . Smokeless tobacco: Never Used  . Alcohol Use: No   OB History    Gravida Para Term Preterm AB TAB SAB Ectopic Multiple Living   1 1  1      0 0     Review of Systems  All other systems reviewed and are negative.     Allergies  Review of patient's allergies indicates no known allergies.  Home Medications   Prior to Admission medications   Medication Sig Start Date End Date Taking? Authorizing Provider  albuterol (PROVENTIL HFA;VENTOLIN HFA) 108 (90 BASE) MCG/ACT inhaler Inhale 1 puff into the lungs every 6 (six) hours as needed for wheezing or shortness of breath.   Yes Historical Provider, MD  cephALEXin (KEFLEX) 500 MG capsule Take 500 mg by mouth 4 (four) times daily.   Yes Historical Provider, MD  docusate sodium (COLACE) 100 MG capsule Take 1 capsule (100 mg total) by mouth 2 (two) times daily. Patient not taking: Reported on 10/15/2014 08/20/14   Linda Hedges, DO   ibuprofen (ADVIL,MOTRIN) 600 MG tablet Take 1 tablet (600 mg total) by mouth every 6 (six) hours. Patient not taking: Reported on 10/15/2014 08/20/14   Linda Hedges, DO  metoprolol (LOPRESSOR) 25 MG tablet Take 4 tablets (100 mg total) by mouth 2 (two) times daily. 10/15/14   Dorie Rank, MD  sertraline (ZOLOFT) 25 MG tablet Take 1 tablet (25 mg total) by mouth daily. Patient not taking: Reported on 10/15/2014 08/20/14   Megan Morris, DO   BP 122/69 mmHg  Pulse 109  Temp(Src) 98.2 F (36.8 C) (Oral)  Resp 16  SpO2 99% Physical Exam  Constitutional: She appears well-developed and well-nourished. No distress.  HENT:  Head: Normocephalic and atraumatic.  Right Ear: External ear normal.  Left Ear: External ear normal.  Eyes: Conjunctivae are normal. Right eye exhibits no discharge. Left eye exhibits no discharge. No scleral icterus.  Neck: Neck supple. No tracheal deviation present. Thyromegaly present.  Cardiovascular: Regular rhythm and intact distal pulses.  Tachycardia present.   Pulmonary/Chest: Effort normal and breath sounds normal. No stridor. No respiratory distress. She has no wheezes. She has no rales.  Abdominal: Soft. Bowel sounds are normal. She exhibits no distension. There is no tenderness.  There is no rebound and no guarding.  Musculoskeletal: She exhibits no edema or tenderness.  Neurological: She is alert. She has normal strength. No cranial nerve deficit (no facial droop, extraocular movements intact, no slurred speech) or sensory deficit. She exhibits normal muscle tone. She displays no seizure activity. Coordination normal.  Skin: Skin is warm and dry. No rash noted.  Psychiatric: She has a normal mood and affect.  Nursing note and vitals reviewed.   ED Course  Procedures (including critical care time) Labs Review Labs Reviewed  BASIC METABOLIC PANEL - Abnormal; Notable for the following:    Glucose, Bld 126 (*)    All other components within normal limits  CBC WITH  DIFFERENTIAL/PLATELET - Abnormal; Notable for the following:    Neutrophils Relative % 39 (*)    All other components within normal limits  POC URINE PREG, ED  Randolm Idol, ED    Imaging Review Dg Chest 2 View  10/15/2014   CLINICAL DATA:  29 year old female with chest pain and sensation of heart racing for the past 2 weeks  EXAM: CHEST  2 VIEW  COMPARISON:  None.  FINDINGS: The lungs are clear and negative for focal airspace consolidation, pulmonary edema or suspicious pulmonary nodule. No pleural effusion or pneumothorax. Cardiac and mediastinal contours are within normal limits. No acute fracture or lytic or blastic osseous lesions. The visualized upper abdominal bowel gas pattern is unremarkable.  IMPRESSION: Normal chest x-ray.   Electronically Signed   By: Jacqulynn Cadet M.D.   On: 10/15/2014 13:52     EKG Interpretation   Date/Time:  Monday October 15 2014 12:33:56 EDT Ventricular Rate:  135 PR Interval:  128 QRS Duration: 88 QT Interval:  290 QTC Calculation: 435 R Axis:   83 Text Interpretation:  Sinus tachycardia T wave abnormality, consider  inferior ischemia Abnormal ECG Confirmed by Hazle Coca [phone]) on 10/15/2014  12:38:08 PM      MDM   Final diagnoses:  Hyperthyroidism    The patient's x-ray and laboratory tests are reassuring. She has a persistent mild sinus tachycardia which is to be expected with her hyperthyroidism. She is stable. No evidence of thyroid storm. I will start the patient on Lopressor 25 mg by mouth twice a day for symptomatic treatment of her hyperthyroidism. I Spoke with Dr. Forde Dandy, endocrinology. He will be able to see the patient in his office this Thursday at 1045.   Dorie Rank, MD 10/15/14 [phone]

## 2014-10-15 NOTE — ED Notes (Signed)
Pt sent here by PCP for eval of palpitations; pt sts has abnormal thyroid levels; pt sts had recent delivery of still born baby at [place]; pt sts pressure in chest

## 2014-10-15 NOTE — ED Notes (Signed)
In radiology

## 2014-10-16 ENCOUNTER — Ambulatory Visit (HOSPITAL_COMMUNITY)
Admission: RE | Admit: 2014-10-16 | Discharge: 2014-10-16 | Disposition: A | Discharge status: Home / Self Care | Payer: Commercial Managed Care - PPO | Source: Ambulatory Visit | Attending: Obstetrics and Gynecology | Admitting: Obstetrics and Gynecology

## 2014-10-16 DIAGNOSIS — E0590 Thyrotoxicosis, unspecified without thyrotoxic crisis or storm: Secondary | ICD-10-CM | POA: Diagnosis not present

## 2014-10-16 DIAGNOSIS — Z3169 Encounter for other general counseling and advice on procreation: Secondary | ICD-10-CM | POA: Diagnosis present

## 2014-10-16 NOTE — ED Notes (Signed)
BP-123/70, P-119

## 2014-10-16 NOTE — ED Notes (Signed)
Wt-126.2lb

## 2014-10-16 NOTE — Consult Note (Signed)
Maternal Fetal Medicine Consultation  Requesting Provider(s): Linda Hedges, DO  Reason for consultation: Recent IUFD at [redacted] weeks gestation  HPI: [name] [name] is a 29 yo G1P0100 seen today for preconception counseling.  [name] [name] experienced an IUFD at [redacted] weeks gestation in June 2016.  The patient reports that her pregnancy was uneventful - had some morning sickness and severe lower extremity swelling. She had an elevated 1 hr OGTT and one abnormal value on her 3 hr test. The first trimester screen showed an increased risk for Trisomy 21 - NIPS was low risk for aneuploidy. She reports that her blood pressures were elevated during labor - not certain whether or not she was started on Magnesium.  She reports that there was some growth lag noted prior to delivery - birth weight was 4# 15 oz.  The couple declined autopsy.  Placental pathology revealed minimal acute chorioamnionitis but no funisitis.  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  Of note, the patient was seen in the ED earlier this week due to palpitations - was diagnosed with hyperthyroidism and started on Metoprolol.  She is scheduled to see Endocrinology later this week.   OB History: OB History    Gravida Para Term Preterm AB TAB SAB Ectopic Multiple Living   1 1  1      0 0      PMH:  Past Medical History  Diagnosis Date  . Asthma   . Panic anxiety syndrome   ? Hx of psoriatic arthritis - no problems during pregnancy.  Not currently on medications.  PSH:  Past Surgical History  Procedure Laterality Date  . No past surgeries    . Wisdom tooth extraction     Meds:  Current Outpatient Prescriptions on File Prior to Encounter  Medication Sig Dispense Refill  . albuterol (PROVENTIL HFA;VENTOLIN HFA) 108 (90 BASE) MCG/ACT inhaler Inhale 1 puff into the lungs every 6 (six) hours as needed for wheezing or shortness of breath.    . cephALEXin (KEFLEX) 500 MG capsule Take 500 mg by  mouth 4 (four) times daily.    Marland Kitchen docusate sodium (COLACE) 100 MG capsule Take 1 capsule (100 mg total) by mouth 2 (two) times daily. (Patient not taking: Reported on 10/15/2014) 14 capsule 0  . ibuprofen (ADVIL,MOTRIN) 600 MG tablet Take 1 tablet (600 mg total) by mouth every 6 (six) hours. (Patient not taking: Reported on 10/15/2014) 30 tablet 0  . metoprolol tartrate (LOPRESSOR) 25 MG tablet Take 1 tablet (25 mg total) by mouth 2 (two) times daily. 30 tablet 1  . sertraline (ZOLOFT) 25 MG tablet Take 1 tablet (25 mg total) by mouth daily. (Patient not taking: Reported on 10/15/2014) 30 tablet 1   No current facility-administered medications on file prior to encounter.   Allergies: No Known Allergies   FH:  Family History  Problem Relation Age of Onset  . Cancer Maternal Grandmother   . Heart disease Maternal Grandmother   Denies family history of birth defects or hereditary disorders  Soc:  History   Social History  . Marital Status: Married    Spouse Name: N/A  . Number of Children: N/A  . Years of Education: N/A   Occupational History  . Not on file.   Social History Main Topics  . Smoking status: Never Smoker   . Smokeless tobacco: Never Used  . Alcohol Use: No  . Drug Use: No  . Sexual Activity: Yes  Other Topics Concern  . Not on file   Social History Narrative    A/P: 1) Hx of IUFD at 14 weeks - declined autopsy.  Cell free fetal DNA during the pregnancy was low risk for aneuploidy.  We had a brief discussion - we may well never know the cause of the stillbirth.  The recently diagnosed hyperthyroidism may have been a contributing factor, but this is very difficult to determine.  Recommendations: 1) Recommend evaluation with Endocrinology as scheduled.  I encouraged the couple to wait at least long enough to be on a stable dose of PTU or Methimazole with her thyroid function well controlled before they try to conceive. 2) Will check anti-phospholipid antibodies (lupus  anticoagulant, ACA, beta2 microglobulin antibodies) 3) Given abnormal 1-hr OGTT - will check HbA1C although feel that this is unlikely to have played a role in the IUFD 4) Would encourage PNV for folic acid supplementation once the couple decides to try to conceive. 5) Once pregnant, would recommend detailed ultrasound with MFM at 18 weeks and serial growth studies every 4-6 weeks thereafter. 6) Antenatal testing beginning no later than 32 weeks (2x weekly NSTS) or earlier based on the clinical scenario 7) Delivery at 39 weeks assuming testing is reassuring.  The couple expressed a desire to deliver at 36 weeks as the fetal loss occurred at that time.  I explained that in the absence of a medical indication, it would be hard to justify delivery at 36 weeks, but that amniocentesis to document fetal lung maturity after 37 weeks may be an option.    Thank you for the opportunity to be a part of the care of [name]. Please contact our office if we can be of further assistance.   I spent approximately 30 minutes with this patient with over 50% of time spent in face-to-face counseling.  Benjaman Lobe, MD Maternal Fetal Medicine

## 2014-10-17 LAB — HEMOGLOBIN A1C
HEMOGLOBIN A1C: 5.5 % (ref 4.8–5.6)
MEAN PLASMA GLUCOSE: 111 mg/dL

## 2014-10-18 ENCOUNTER — Other Ambulatory Visit (HOSPITAL_COMMUNITY): Payer: Self-pay | Admitting: Endocrinology

## 2014-10-18 DIAGNOSIS — E0500 Thyrotoxicosis with diffuse goiter without thyrotoxic crisis or storm: Secondary | ICD-10-CM

## 2014-10-18 LAB — BETA-2-GLYCOPROTEIN I ABS, IGG/M/A: Beta-2-Glycoprotein I IgA: <9 GPI IgA units (ref 0–25)

## 2014-10-18 LAB — CARDIOLIPIN ANTIBODIES, IGG, IGM, IGA
Anticardiolipin IgA: <9 APL U/mL (ref 0–11)
Anticardiolipin IgG: <9 GPL U/mL (ref 0–14)
Anticardiolipin IgM: <9 MPL U/mL (ref 0–12)

## 2014-10-19 LAB — LUPUS ANTICOAGULANT PANEL
DRVVT: 35.2 s (ref 0.0–55.1)
PTT Lupus Anticoagulant: 33.9 s (ref 0.0–50.0)

## 2014-10-22 ENCOUNTER — Telehealth (HOSPITAL_COMMUNITY): Payer: Self-pay | Admitting: *Deleted

## 2014-10-22 NOTE — Telephone Encounter (Signed)
[name] [name] with her lab results for Beta-2, Lupus, anticardiolipin, and HgbA1c.  Notified that these were reviewed with Dr. Lisbeth Renshaw and all WNL.  Pt verbalized understanding.

## 2014-10-25 ENCOUNTER — Other Ambulatory Visit (HOSPITAL_COMMUNITY): Payer: Self-pay | Admitting: Obstetrics and Gynecology

## 2014-11-05 ENCOUNTER — Encounter (HOSPITAL_COMMUNITY)
Admission: RE | Admit: 2014-11-05 | Discharge: 2014-11-05 | Disposition: A | Discharge status: Home / Self Care | Payer: Commercial Managed Care - PPO | Source: Ambulatory Visit | Attending: Endocrinology | Admitting: Endocrinology

## 2014-11-05 DIAGNOSIS — E0500 Thyrotoxicosis with diffuse goiter without thyrotoxic crisis or storm: Secondary | ICD-10-CM | POA: Insufficient documentation

## 2014-11-06 ENCOUNTER — Encounter (HOSPITAL_COMMUNITY)
Admission: RE | Admit: 2014-11-06 | Discharge: 2014-11-06 | Disposition: A | Discharge status: Home / Self Care | Payer: Commercial Managed Care - PPO | Source: Ambulatory Visit | Attending: Endocrinology | Admitting: Endocrinology

## 2014-11-06 DIAGNOSIS — E0500 Thyrotoxicosis with diffuse goiter without thyrotoxic crisis or storm: Secondary | ICD-10-CM | POA: Diagnosis present

## 2014-11-06 MED ORDER — SODIUM PERTECHNETATE TC 99M INJECTION
10.0000 | Freq: Once | INTRAVENOUS | Status: AC | PRN
  Administered 2014-11-06: 10 via INTRAVENOUS

## 2014-11-06 MED ORDER — SODIUM IODIDE I 131 CAPSULE
6.8000 | Freq: Once | INTRAVENOUS | Status: AC | PRN
  Administered 2014-11-06: 6.8 via ORAL

## 2014-11-09 ENCOUNTER — Other Ambulatory Visit (HOSPITAL_COMMUNITY): Payer: Self-pay | Admitting: Obstetrics and Gynecology

## 2016-01-29 ENCOUNTER — Inpatient Hospital Stay (HOSPITAL_COMMUNITY)
Admission: AD | Admit: 2016-01-29 | Discharge: 2016-01-29 | Disposition: A | Discharge status: Home / Self Care | Payer: 59 | Source: Ambulatory Visit | Attending: Family Medicine | Admitting: Family Medicine

## 2016-01-29 ENCOUNTER — Inpatient Hospital Stay (HOSPITAL_COMMUNITY): Payer: 59

## 2016-01-29 ENCOUNTER — Encounter (HOSPITAL_COMMUNITY): Payer: Self-pay | Admitting: Student

## 2016-01-29 DIAGNOSIS — Z3A01 Less than 8 weeks gestation of pregnancy: Secondary | ICD-10-CM | POA: Diagnosis not present

## 2016-01-29 DIAGNOSIS — O3680X0 Pregnancy with inconclusive fetal viability, not applicable or unspecified: Secondary | ICD-10-CM

## 2016-01-29 DIAGNOSIS — R109 Unspecified abdominal pain: Secondary | ICD-10-CM

## 2016-01-29 DIAGNOSIS — R1030 Lower abdominal pain, unspecified: Secondary | ICD-10-CM | POA: Diagnosis not present

## 2016-01-29 DIAGNOSIS — O26891 Other specified pregnancy related conditions, first trimester: Secondary | ICD-10-CM | POA: Diagnosis not present

## 2016-01-29 DIAGNOSIS — O26899 Other specified pregnancy related conditions, unspecified trimester: Secondary | ICD-10-CM

## 2016-01-29 HISTORY — DX: Thyrotoxicosis with diffuse goiter without thyrotoxic crisis or storm: E05.00

## 2016-01-29 LAB — CBC
HCT: 35.6 % — ABNORMAL LOW (ref 36.0–46.0)
Hemoglobin: 12.4 g/dL (ref 12.0–15.0)
MCH: 31.7 pg (ref 26.0–34.0)
MCHC: 34.8 g/dL (ref 30.0–36.0)
MCV: 91 fL (ref 78.0–100.0)
PLATELETS: 275 10*3/uL (ref 150–400)
RBC: 3.91 MIL/uL (ref 3.87–5.11)
RDW: 14.5 % (ref 11.5–15.5)
WBC: 10.5 10*3/uL (ref 4.0–10.5)

## 2016-01-29 LAB — WET PREP, GENITAL
Clue Cells Wet Prep HPF POC: NONE SEEN
Sperm: NONE SEEN
TRICH WET PREP: NONE SEEN
YEAST WET PREP: NONE SEEN

## 2016-01-29 LAB — URINALYSIS, ROUTINE W REFLEX MICROSCOPIC
BILIRUBIN URINE: NEGATIVE
Glucose, UA: NEGATIVE mg/dL
Hgb urine dipstick: NEGATIVE
Ketones, ur: NEGATIVE mg/dL
NITRITE: NEGATIVE
Protein, ur: NEGATIVE mg/dL
Specific Gravity, Urine: 1.01 (ref 1.005–1.030)
pH: 6 (ref 5.0–8.0)

## 2016-01-29 LAB — URINE MICROSCOPIC-ADD ON: RBC / HPF: NONE SEEN RBC/hpf (ref 0–5)

## 2016-01-29 LAB — POCT PREGNANCY, URINE: PREG TEST UR: POSITIVE — AB

## 2016-01-29 LAB — HCG, QUANTITATIVE, PREGNANCY: HCG, BETA CHAIN, QUANT, S: 5052 m[IU]/mL — AB (ref <5)

## 2016-01-29 NOTE — MAU Provider Note (Signed)
History     CSN: [id number]  Arrival date and time: 01/29/16 [id number]   None      Chief Complaint  Patient presents with  . Abdominal Pain   HPI [name] [name] is a 30 y.o. G2P0100 at [redacted]w[redacted]d by LMP who presents from urgent care with abdominal cramping. OB history significant for 36 week IUFD last year. Plans on going to [facility] in [place] for prenatal care.  Reports lower abdominal cramping since this morning. Rates pain 6/10. Has no treated. Nothing makes better or worse. Denies n/v, vaginal bleeding, dysuria, or vaginal discharge. Had an episode of diarrhea yesterday; no BM today.   OB History    Gravida Para Term Preterm AB Living   2 1   1    0   SAB TAB Ectopic Multiple Live Births         0        Past Medical History:  Diagnosis Date  . Asthma   . Graves disease     Past Surgical History:  Procedure Laterality Date  . WISDOM TOOTH EXTRACTION      Family History  Problem Relation Age of Onset  . Cancer Maternal Grandmother   . Heart disease Maternal Grandmother     Social History  Substance Use Topics  . Smoking status: Never Smoker  . Smokeless tobacco: Never Used  . Alcohol use No    Allergies: No Known Allergies  Prescriptions Prior to Admission  Medication Sig Dispense Refill Last Dose  . albuterol (PROVENTIL HFA;VENTOLIN HFA) 108 (90 BASE) MCG/ACT inhaler Inhale 1 puff into the lungs every 6 (six) hours as needed for wheezing or shortness of breath.   unk  . cephALEXin (KEFLEX) 500 MG capsule Take 500 mg by mouth 4 (four) times daily.   10/15/2014 at Unknown time  . docusate sodium (COLACE) 100 MG capsule Take 1 capsule (100 mg total) by mouth 2 (two) times daily. (Patient not taking: Reported on 10/15/2014) 14 capsule 0   . ibuprofen (ADVIL,MOTRIN) 600 MG tablet Take 1 tablet (600 mg total) by mouth every 6 (six) hours. (Patient not taking: Reported on 10/15/2014) 30 tablet 0   . metoprolol tartrate (LOPRESSOR) 25 MG tablet Take 1 tablet (25  mg total) by mouth 2 (two) times daily. 30 tablet 1   . Prenat-FeFmCb-DSS-FA-DHA w/o A (CITRANATAL HARMONY) 27-1-260 MG CAPS      . propylthiouracil (PTU) 50 MG tablet      . sertraline (ZOLOFT) 25 MG tablet Take 1 tablet (25 mg total) by mouth daily. (Patient not taking: Reported on 10/15/2014) 30 tablet 1     Review of Systems  Constitutional: Negative.   Gastrointestinal: Positive for abdominal pain and nausea. Negative for constipation, diarrhea and vomiting.  Genitourinary: Negative for dysuria.       No vaginal bleeding   Physical Exam   Blood pressure 106/60, pulse 94, temperature 98 F (36.7 C), temperature source Oral, resp. rate 18, last menstrual period 12/27/2015.  Physical Exam  Nursing note and vitals reviewed. Constitutional: She is oriented to person, place, and time. She appears well-developed and well-nourished. No distress.  HENT:  Head: Normocephalic and atraumatic.  Eyes: Conjunctivae are normal. Right eye exhibits no discharge. Left eye exhibits no discharge. No scleral icterus.  Neck: Normal range of motion.  Cardiovascular: Normal rate, regular rhythm and normal heart sounds.   No murmur heard. Respiratory: Effort normal and breath sounds normal. No respiratory distress. She has no wheezes.  GI:  Soft. Bowel sounds are normal. She exhibits no distension. There is no tenderness. There is no rebound and no guarding.  Genitourinary: Uterus normal. Cervix exhibits no motion tenderness. Right adnexum displays no mass and no tenderness. Left adnexum displays no mass and no tenderness.  Genitourinary Comments: Cervix closed  Neurological: She is alert and oriented to person, place, and time.  Skin: Skin is warm and dry. She is not diaphoretic.  Psychiatric: She has a normal mood and affect. Her behavior is normal. Judgment and thought content normal.    MAU Course  Procedures Results for orders placed or performed during the hospital encounter of 01/29/16 (from the  past 72 hour(s))  Urinalysis, Routine w reflex microscopic (not at [facility])     Status: Abnormal   Collection Time: 01/29/16  6:54 PM  Result Value Ref Range   Color, Urine YELLOW YELLOW   APPearance CLEAR CLEAR   Specific Gravity, Urine 1.010 1.005 - 1.030   pH 6.0 5.0 - 8.0   Glucose, UA NEGATIVE NEGATIVE mg/dL   Hgb urine dipstick NEGATIVE NEGATIVE   Bilirubin Urine NEGATIVE NEGATIVE   Ketones, ur NEGATIVE NEGATIVE mg/dL   Protein, ur NEGATIVE NEGATIVE mg/dL   Nitrite NEGATIVE NEGATIVE   Leukocytes, UA SMALL (A) NEGATIVE  Urine microscopic-add on     Status: Abnormal   Collection Time: 01/29/16  6:54 PM  Result Value Ref Range   Squamous Epithelial / LPF 0-5 (A) NONE SEEN   WBC, UA 0-5 0 - 5 WBC/hpf   RBC / HPF NONE SEEN 0 - 5 RBC/hpf   Bacteria, UA RARE (A) NONE SEEN  Pregnancy, urine POC     Status: Abnormal   Collection Time: 01/29/16  7:01 PM  Result Value Ref Range   Preg Test, Ur POSITIVE (A) NEGATIVE    Comment:        THE SENSITIVITY OF THIS METHODOLOGY IS >24 mIU/mL   CBC     Status: Abnormal   Collection Time: 01/29/16  7:27 PM  Result Value Ref Range   WBC 10.5 4.0 - 10.5 K/uL   RBC 3.91 3.87 - 5.11 MIL/uL   Hemoglobin 12.4 12.0 - 15.0 g/dL   HCT 35.6 (L) 36.0 - 46.0 %   MCV 91.0 78.0 - 100.0 fL   MCH 31.7 26.0 - 34.0 pg   MCHC 34.8 30.0 - 36.0 g/dL   RDW 14.5 11.5 - 15.5 %   Platelets 275 150 - 400 K/uL  hCG, quantitative, pregnancy     Status: Abnormal   Collection Time: 01/29/16  7:27 PM  Result Value Ref Range   hCG, Beta Chain, Quant, S 5,052 (H) <5 mIU/mL    Comment:          GEST. AGE      CONC.  (mIU/mL)   <=1 WEEK        5 - 50     2 WEEKS       50 - 500     3 WEEKS       100 - 10,000     4 WEEKS     1,000 - 30,000     5 WEEKS     3,500 - 115,000   6-8 WEEKS     12,000 - 270,000    12 WEEKS     15,000 - 220,000        FEMALE AND NON-PREGNANT FEMALE:     LESS THAN 5 mIU/mL   HIV antibody  Status: None   Collection Time: 01/29/16  7:27 PM   Result Value Ref Range   HIV Screen 4th Generation wRfx Non Reactive Non Reactive    Comment: (NOTE) Performed At: [facility] [address] [place], [place] [id number] Lindon Romp MD [id number]   Wet prep, genital     Status: Abnormal   Collection Time: 01/29/16  7:32 PM  Result Value Ref Range   Yeast Wet Prep HPF POC NONE SEEN NONE SEEN   Trich, Wet Prep NONE SEEN NONE SEEN   Clue Cells Wet Prep HPF POC NONE SEEN NONE SEEN   WBC, Wet Prep HPF POC MANY (A) NONE SEEN    Comment: MODERATE BACTERIA SEEN   Sperm NONE SEEN    US Ob Comp Less 14 Wks  Result Date: 01/29/2016 CLINICAL DATA:  Lower abdominal pain EXAM: OBSTETRIC <14 WK [place] AND TRANSVAGINAL OB US TECHNIQUE: Both transabdominal and transvaginal ultrasound examinations were performed for complete evaluation of the gestation as well as the maternal uterus, adnexal regions, and pelvic cul-de-sac. Transvaginal technique was performed to assess early pregnancy. COMPARISON:  None. FINDINGS: Intrauterine gestational sac: Single probable intrauterine gestational sac visualized. Yolk sac:  Not visualized Embryo:  Not visualized Cardiac Activity: Not visualized MSD: 6.1  mm   5 w   2  d Subchorionic hemorrhage:  None visualized. Maternal uterus/adnexae: Maternal uterus grossly unremarkable. The bilateral ovaries are within normal limits with left ovarian corpus luteal cyst. The right ovary measures 2 x 1.7 by 1.9 cm. The left ovary measures 3.2 x 4.4 x 2.4 cm. Moderate anechoic free pelvic fluid. IMPRESSION: Probable early intrauterine gestational sac, but no yolk sac, fetal pole, or cardiac activity yet visualized. Recommend follow-up quantitative B-HCG levels and follow-up US in 14 days to assess viability. This recommendation follows SRU consensus guidelines: Diagnostic Criteria for Nonviable Pregnancy Early in the First Trimester. [place] Med [id number]. Moderate anechoic free fluid in the pelvis. Electronically  Signed   By: Donavan Foil M.D.   On: 01/29/2016 20:09   US Ob Transvaginal  Result Date: 01/29/2016 CLINICAL DATA:  Lower abdominal pain EXAM: OBSTETRIC <14 WK [place] AND TRANSVAGINAL OB US TECHNIQUE: Both transabdominal and transvaginal ultrasound examinations were performed for complete evaluation of the gestation as well as the maternal uterus, adnexal regions, and pelvic cul-de-sac. Transvaginal technique was performed to assess early pregnancy. COMPARISON:  None. FINDINGS: Intrauterine gestational sac: Single probable intrauterine gestational sac visualized. Yolk sac:  Not visualized Embryo:  Not visualized Cardiac Activity: Not visualized MSD: 6.1  mm   5 w   2  d Subchorionic hemorrhage:  None visualized. Maternal uterus/adnexae: Maternal uterus grossly unremarkable. The bilateral ovaries are within normal limits with left ovarian corpus luteal cyst. The right ovary measures 2 x 1.7 by 1.9 cm. The left ovary measures 3.2 x 4.4 x 2.4 cm. Moderate anechoic free pelvic fluid. IMPRESSION: Probable early intrauterine gestational sac, but no yolk sac, fetal pole, or cardiac activity yet visualized. Recommend follow-up quantitative B-HCG levels and follow-up US in 14 days to assess viability. This recommendation follows SRU consensus guidelines: Diagnostic Criteria for Nonviable Pregnancy Early in the First Trimester. [place] [employer] Med [id number]. Moderate anechoic free fluid in the pelvis. Electronically Signed   By: Donavan Foil M.D.   On: 01/29/2016 20:09     MDM +UPT UA, wet prep, GC/chlamydia, CBC, ABO/Rh, quant hCG, HIV, and [place] today to rule out ectopic pregnancy  O positive Ultrasound shows  possible IUGS, no yolk sac; BHCG >5000 Reviewed results with Dr. Ilda Basset. Will have patient return in 48 hrs for repeat BHCG  This abdominal pain could represent a normal pregnancy, spontaneous abortion, or even an ectopic pregnancy which can be life-threatening. Cultures were obtained to rule out pelvic  infection.   Assessment and Plan  A: 1. Pregnancy of unknown anatomic location   2. Abdominal pain affecting pregnancy    P: Discharge home Strict return precautions for s/s of ectopic pregnancy Return to MAU Friday or Saturday morning for repeat BHCG  Jorje Guild 01/29/2016, 7:18 PM

## 2016-01-29 NOTE — Discharge Instructions (Signed)
Abdominal Pain During Pregnancy  Abdominal pain is common in pregnancy. Most of the time, it does not cause harm. There are many causes of abdominal pain. Some causes are more serious than others and sometimes the cause is not known. Abdominal pain can be a sign that something is very wrong with the pregnancy or the pain may have nothing to do with the pregnancy. Always tell your health care provider if you have any abdominal pain.  Follow these instructions at home:  · Do not have sex or put anything in your vagina until your symptoms go away completely.  · Watch your abdominal pain for any changes.  · Get plenty of rest until your pain improves.  · Drink enough fluid to keep your urine clear or pale yellow.  · Take over-the-counter or prescription medicines only as told by your health care provider.  · Keep all follow-up visits as told by your health care provider. This is important.  Contact a health care provider if:  · You have a fever.  · Your pain gets worse or you have cramping.  · Your pain continues after resting.  Get help right away if:  · You are bleeding, leaking fluid, or passing tissue from the vagina.  · You have vomiting or diarrhea that does not go away.  · You have painful or bloody urination.  · You notice a decrease in your baby's movements.  · You feel very weak or faint.  · You have shortness of breath.  · You develop a severe headache with abdominal pain.  · You have abnormal vaginal discharge with abdominal pain.  This information is not intended to replace advice given to you by your health care provider. Make sure you discuss any questions you have with your health care provider.  Document Released: 02/23/2005 Document Revised: 12/05/2015 Document Reviewed: 09/22/2012  Elsevier Interactive Patient Education © 2017 [employer]

## 2016-01-29 NOTE — MAU Note (Signed)
Pt presents MAU with lower abdominal pain. Pt reports + HPT about a week ago. Was at Urgent Care today and was sent here. Pain began at 1200 today and is discribed a dull ache. Has had urinary frequency. Denies any abnormal discharge or bleeding.

## 2016-01-30 LAB — HIV ANTIBODY (ROUTINE TESTING W REFLEX): HIV SCREEN 4TH GENERATION: NONREACTIVE

## 2016-01-31 ENCOUNTER — Inpatient Hospital Stay (HOSPITAL_COMMUNITY)
Admission: AD | Admit: 2016-01-31 | Discharge: 2016-01-31 | Disposition: A | Discharge status: Home / Self Care | Payer: 59 | Source: Ambulatory Visit | Attending: Obstetrics & Gynecology | Admitting: Obstetrics & Gynecology

## 2016-01-31 DIAGNOSIS — Z3490 Encounter for supervision of normal pregnancy, unspecified, unspecified trimester: Secondary | ICD-10-CM | POA: Diagnosis not present

## 2016-01-31 DIAGNOSIS — Z3491 Encounter for supervision of normal pregnancy, unspecified, first trimester: Secondary | ICD-10-CM | POA: Insufficient documentation

## 2016-01-31 DIAGNOSIS — Z3A01 Less than 8 weeks gestation of pregnancy: Secondary | ICD-10-CM | POA: Diagnosis not present

## 2016-01-31 DIAGNOSIS — O3680X0 Pregnancy with inconclusive fetal viability, not applicable or unspecified: Secondary | ICD-10-CM

## 2016-01-31 LAB — GC/CHLAMYDIA PROBE AMP (~~LOC~~) NOT AT ARMC
CHLAMYDIA, DNA PROBE: NEGATIVE
NEISSERIA GONORRHEA: NEGATIVE

## 2016-01-31 LAB — HCG, QUANTITATIVE, PREGNANCY: hCG, Beta Chain, Quant, S: 12425 m[IU]/mL — ABNORMAL HIGH (ref <5)

## 2016-01-31 NOTE — MAU Note (Signed)
Dr Vanetta Shawl in Triage to talk with pt regarding lab results and d/c plan. Written d/c instructions given per Dr Vanetta Shawl along with list of providers. Pt and spouse d/c home from Triage

## 2016-01-31 NOTE — MAU Provider Note (Signed)
Chief Complaint: Follow-up   SUBJECTIVE HPI: [name] [name] is a 30 y.o. G2P0100 at [redacted]w[redacted]d who presents to Maternity Admissions reporting she is here for follow up BHCG.  Patient is here for follow up BHCG, husband is present with her. Patient was just recently here in MAU 2 days ago for abdominal pain. She just recently (2016) had a 36 week IUFD of unknown cause. She and her husband desire this pregnancy, and have been trying. Patient's symptoms of abdominal pain and diarrhea has resolved. She denies any abdominal pain or cramping today. Denies any abnormal vaginal discharge or vaginal bleeding. She has known Graves Disease and has recently been changed to PTU by her endocrinologist. She denies any CP, SOB, F/C, abdominal pain, VB, VD, N/V/D.    Past Medical History:  Diagnosis Date  . Asthma   . Graves disease    OB History  Gravida Para Term Preterm AB Living  2 1   1    0  SAB TAB Ectopic Multiple Live Births        0      # Outcome Date GA Lbr Len/2nd Weight Sex Delivery Anes PTL Lv  2 Current           1 Preterm 08/19/14 [redacted]w[redacted]d 02:25 / 00:26 5 lb 5 oz (2.41 kg) F Vag-Spont Local  FD     Past Surgical History:  Procedure Laterality Date  . WISDOM TOOTH EXTRACTION     Social History   Social History  . Marital status: Married    Spouse name: N/A  . Number of children: N/A  . Years of education: N/A   Occupational History  . Not on file.   Social History Main Topics  . Smoking status: Never Smoker  . Smokeless tobacco: Never Used  . Alcohol use No  . Drug use: No  . Sexual activity: Yes   Other Topics Concern  . Not on file   Social History Narrative  . No narrative on file   No current facility-administered medications on file prior to encounter.    Current Outpatient Prescriptions on File Prior to Encounter  Medication Sig Dispense Refill  . albuterol (PROVENTIL HFA;VENTOLIN HFA) 108 (90 BASE) MCG/ACT inhaler Inhale 1 puff into the lungs every 6 (six) hours  as needed for wheezing or shortness of breath.    . cephALEXin (KEFLEX) 500 MG capsule Take 500 mg by mouth 4 (four) times daily.    . metoprolol tartrate (LOPRESSOR) 25 MG tablet Take 1 tablet (25 mg total) by mouth 2 (two) times daily. 30 tablet 1  . Prenat-FeFmCb-DSS-FA-DHA w/o A (CITRANATAL HARMONY) 27-1-260 MG CAPS     . propylthiouracil (PTU) 50 MG tablet      No Known Allergies  I have reviewed the past Medical Hx, Surgical Hx, Social Hx, Allergies and Medications.   REVIEW OF SYSTEMS  A comprehensive ROS was negative except per HPI.     OBJECTIVE Patient Vitals for the past 24 hrs:  BP Temp Pulse Resp Height Weight  01/31/16 2001 118/70 98.1 F (36.7 C) 87 18 5\' 4"  (1.626 m) 131 lb 12.8 oz (59.8 kg)    PHYSICAL EXAM Constitutional: Well-developed, well-nourished female in no acute distress.  Cardiovascular: normal rate, rhythm, no murmurs Respiratory: normal rate and effort, CTAB GI: Abd soft, non-tender, non-distended. Pos BS x 4 MS: Extremities nontender, no edema, normal ROM Neurologic: Alert and oriented x 4.  GU: Neg CVAT.   LAB RESULTS Results for orders placed or  performed during the hospital encounter of 01/31/16 (from the past 24 hour(s))  hCG, quantitative, pregnancy     Status: Abnormal   Collection Time: 01/31/16  8:06 PM  Result Value Ref Range   hCG, Beta Chain, Quant, S 12,425 (H) <5 mIU/mL    IMAGING US Ob Comp Less 14 Wks  Result Date: 01/29/2016 CLINICAL DATA:  Lower abdominal pain EXAM: OBSTETRIC <14 WK [place] AND TRANSVAGINAL OB US TECHNIQUE: Both transabdominal and transvaginal ultrasound examinations were performed for complete evaluation of the gestation as well as the maternal uterus, adnexal regions, and pelvic cul-de-sac. Transvaginal technique was performed to assess early pregnancy. COMPARISON:  None. FINDINGS: Intrauterine gestational sac: Single probable intrauterine gestational sac visualized. Yolk sac:  Not visualized Embryo:  Not  visualized Cardiac Activity: Not visualized MSD: 6.1  mm   5 w   2  d Subchorionic hemorrhage:  None visualized. Maternal uterus/adnexae: Maternal uterus grossly unremarkable. The bilateral ovaries are within normal limits with left ovarian corpus luteal cyst. The right ovary measures 2 x 1.7 by 1.9 cm. The left ovary measures 3.2 x 4.4 x 2.4 cm. Moderate anechoic free pelvic fluid. IMPRESSION: Probable early intrauterine gestational sac, but no yolk sac, fetal pole, or cardiac activity yet visualized. Recommend follow-up quantitative B-HCG levels and follow-up US in 14 days to assess viability. This recommendation follows SRU consensus guidelines: Diagnostic Criteria for Nonviable Pregnancy Early in the First Trimester. [place] Med [id number]. Moderate anechoic free fluid in the pelvis. Electronically Signed   By: Donavan Foil M.D.   On: 01/29/2016 20:09   US Ob Transvaginal  Result Date: 01/29/2016 CLINICAL DATA:  Lower abdominal pain EXAM: OBSTETRIC <14 WK [place] AND TRANSVAGINAL OB US TECHNIQUE: Both transabdominal and transvaginal ultrasound examinations were performed for complete evaluation of the gestation as well as the maternal uterus, adnexal regions, and pelvic cul-de-sac. Transvaginal technique was performed to assess early pregnancy. COMPARISON:  None. FINDINGS: Intrauterine gestational sac: Single probable intrauterine gestational sac visualized. Yolk sac:  Not visualized Embryo:  Not visualized Cardiac Activity: Not visualized MSD: 6.1  mm   5 w   2  d Subchorionic hemorrhage:  None visualized. Maternal uterus/adnexae: Maternal uterus grossly unremarkable. The bilateral ovaries are within normal limits with left ovarian corpus luteal cyst. The right ovary measures 2 x 1.7 by 1.9 cm. The left ovary measures 3.2 x 4.4 x 2.4 cm. Moderate anechoic free pelvic fluid. IMPRESSION: Probable early intrauterine gestational sac, but no yolk sac, fetal pole, or cardiac activity yet visualized.  Recommend follow-up quantitative B-HCG levels and follow-up US in 14 days to assess viability. This recommendation follows SRU consensus guidelines: Diagnostic Criteria for Nonviable Pregnancy Early in the First Trimester. [place] [employer] Med [id number]. Moderate anechoic free fluid in the pelvis. Electronically Signed   By: Donavan Foil M.D.   On: 01/29/2016 20:09    MAU COURSE Reviewed results of repeat BHCG with patient Set up appt in 1 week for repeat US    MDM Plan of care reviewed with patient, including labs and tests ordered and medical treatment. Discussed ultrasound results and appropriately rising B-HCG. Discussed plan to have repeat ultrasound in 1 week and follow up office appt for results. Warning signs/symptoms given of miscarriage and ectopic and when to return to MAU.    ASSESSMENT 1. Pregnancy of unknown anatomic location     PLAN Discharge home in stable condition. Follow up in 1 week for [place] to confirm pregnancy, with office  visit for results. Encouraged follow up with OB of choice shortly after.    Follow-up [place] for [facility]. Schedule an appointment as soon as possible for a visit in 1 week(s).   Specialty:  Obstetrics and Gynecology Why:  For ultrasound results. Follow up with an OB provider of your choice Contact information: [place] [phone]           Medication List    TAKE these medications   albuterol 108 (90 Base) MCG/ACT inhaler Commonly known as:  PROVENTIL HFA;VENTOLIN HFA Inhale 1 puff into the lungs every 6 (six) hours as needed for wheezing or shortness of breath.   cephALEXin 500 MG capsule Commonly known as:  KEFLEX Take 500 mg by mouth 4 (four) times daily.   CITRANATAL HARMONY 27-1-260 MG Caps   metoprolol tartrate 25 MG tablet Commonly known as:  LOPRESSOR Take 1 tablet (25 mg total) by mouth 2 (two) times daily.   propylthiouracil 50 MG  tablet Commonly known as:  PTU        Katherine Basset, DO OB Fellow 01/31/2016 9:51 PM

## 2016-01-31 NOTE — Discharge Instructions (Signed)

## 2016-01-31 NOTE — MAU Note (Signed)
Here for follow up labs. Denies vag bleeding. Slight clear d/c. Had some abd few days ago but is better now. Thinks may have been gas. Occ "twinge" R side but better than two days ago. Had BM yesterday. Hx IUFD at term

## 2016-02-17 ENCOUNTER — Inpatient Hospital Stay (HOSPITAL_COMMUNITY): Payer: 59

## 2016-02-17 ENCOUNTER — Inpatient Hospital Stay (HOSPITAL_COMMUNITY)
Admission: AD | Admit: 2016-02-17 | Discharge: 2016-02-17 | Disposition: A | Discharge status: Home / Self Care | Payer: 59 | Source: Ambulatory Visit | Attending: Family Medicine | Admitting: Family Medicine

## 2016-02-17 DIAGNOSIS — O209 Hemorrhage in early pregnancy, unspecified: Secondary | ICD-10-CM | POA: Diagnosis not present

## 2016-02-17 DIAGNOSIS — O3680X0 Pregnancy with inconclusive fetal viability, not applicable or unspecified: Secondary | ICD-10-CM

## 2016-02-17 DIAGNOSIS — O4690 Antepartum hemorrhage, unspecified, unspecified trimester: Secondary | ICD-10-CM

## 2016-02-17 DIAGNOSIS — O4691 Antepartum hemorrhage, unspecified, first trimester: Secondary | ICD-10-CM

## 2016-02-17 DIAGNOSIS — Z3A01 Less than 8 weeks gestation of pregnancy: Secondary | ICD-10-CM | POA: Diagnosis not present

## 2016-02-17 DIAGNOSIS — R109 Unspecified abdominal pain: Secondary | ICD-10-CM | POA: Diagnosis present

## 2016-02-17 LAB — URINALYSIS, ROUTINE W REFLEX MICROSCOPIC
Bacteria, UA: NONE SEEN
Bilirubin Urine: NEGATIVE
GLUCOSE, UA: NEGATIVE mg/dL
KETONES UR: NEGATIVE mg/dL
Leukocytes, UA: NEGATIVE
Nitrite: NEGATIVE
Protein, ur: NEGATIVE mg/dL
Specific Gravity, Urine: 1.02 (ref 1.005–1.030)
pH: 6 (ref 5.0–8.0)

## 2016-02-17 LAB — CBC
HEMATOCRIT: 33.5 % — AB (ref 36.0–46.0)
Hemoglobin: 11.7 g/dL — ABNORMAL LOW (ref 12.0–15.0)
MCH: 32.4 pg (ref 26.0–34.0)
MCHC: 34.9 g/dL (ref 30.0–36.0)
MCV: 92.8 fL (ref 78.0–100.0)
Platelets: 245 10*3/uL (ref 150–400)
RBC: 3.61 MIL/uL — AB (ref 3.87–5.11)
RDW: 14.1 % (ref 11.5–15.5)
WBC: 8 10*3/uL (ref 4.0–10.5)

## 2016-02-17 NOTE — Discharge Instructions (Signed)
First Trimester of Pregnancy  The first trimester of pregnancy is from week 1 until the end of week 12 (months 1 through 3). A week after a sperm fertilizes an egg, the egg will implant on the wall of the uterus. This embryo will begin to develop into a baby. Genes from you and your partner are forming the baby. The female genes determine whether the baby is a boy or a girl. At 6-8 weeks, the eyes and face are formed, and the heartbeat can be seen on ultrasound. At the end of 12 weeks, all the baby's organs are formed.   Now that you are pregnant, you will want to do everything you can to have a healthy baby. Two of the most important things are to get good prenatal care and to follow your health care provider's instructions. Prenatal care is all the medical care you receive before the baby's birth. This care will help prevent, find, and treat any problems during the pregnancy and childbirth.  BODY CHANGES  Your body goes through many changes during pregnancy. The changes vary from woman to woman.   · You may gain or lose a couple of pounds at first.  · You may feel sick to your stomach (nauseous) and throw up (vomit). If the vomiting is uncontrollable, call your health care provider.  · You may tire easily.  · You may develop headaches that can be relieved by medicines approved by your health care provider.  · You may urinate more often. Painful urination may mean you have a bladder infection.  · You may develop heartburn as a result of your pregnancy.  · You may develop constipation because certain hormones are causing the muscles that push waste through your intestines to slow down.  · You may develop hemorrhoids or swollen, bulging veins (varicose veins).  · Your breasts may begin to grow larger and become tender. Your nipples may stick out more, and the tissue that surrounds them (areola) may become darker.  · Your gums may bleed and may be sensitive to brushing and flossing.   · Dark spots or blotches (chloasma, mask of pregnancy) may develop on your face. This will likely fade after the baby is born.  · Your menstrual periods will stop.  · You may have a loss of appetite.  · You may develop cravings for certain kinds of food.  · You may have changes in your emotions from day to day, such as being excited to be pregnant or being concerned that something may go wrong with the pregnancy and baby.  · You may have more vivid and strange dreams.  · You may have changes in your hair. These can include thickening of your hair, rapid growth, and changes in texture. Some women also have hair loss during or after pregnancy, or hair that feels dry or thin. Your hair will most likely return to normal after your baby is born.  WHAT TO EXPECT AT YOUR PRENATAL VISITS  During a routine prenatal visit:  · You will be weighed to make sure you and the baby are growing normally.  · Your blood pressure will be taken.  · Your abdomen will be measured to track your baby's growth.  · The fetal heartbeat will be listened to starting around week 10 or 12 of your pregnancy.  · Test results from any previous visits will be discussed.  Your health care provider may ask you:  · How you are feeling.  · If you   are feeling the baby move.  · If you have had any abnormal symptoms, such as leaking fluid, bleeding, severe headaches, or abdominal cramping.  · If you are using any tobacco products, including cigarettes, chewing tobacco, and electronic cigarettes.  · If you have any questions.  Other tests that may be performed during your first trimester include:  · Blood tests to find your blood type and to check for the presence of any previous infections. They will also be used to check for low iron levels (anemia) and Rh antibodies. Later in the pregnancy, blood tests for diabetes will be done along with other tests if problems develop.  · Urine tests to check for infections, diabetes, or protein in the urine.   · An ultrasound to confirm the proper growth and development of the baby.  · An amniocentesis to check for possible genetic problems.  · Fetal screens for spina bifida and Down syndrome.  · You may need other tests to make sure you and the baby are doing well.  · HIV (human immunodeficiency virus) testing. Routine prenatal testing includes screening for HIV, unless you choose not to have this test.  HOME CARE INSTRUCTIONS   Medicines  · Follow your health care provider's instructions regarding medicine use. Specific medicines may be either safe or unsafe to take during pregnancy.  · Take your prenatal vitamins as directed.  · If you develop constipation, try taking a stool softener if your health care provider approves.  Diet  · Eat regular, well-balanced meals. Choose a variety of foods, such as meat or vegetable-based protein, fish, milk and low-fat dairy products, vegetables, fruits, and whole grain breads and cereals. Your health care provider will help you determine the amount of weight gain that is right for you.  · Avoid raw meat and uncooked cheese. These carry germs that can cause birth defects in the baby.  · Eating four or five small meals rather than three large meals a day may help relieve nausea and vomiting. If you start to feel nauseous, eating a few soda crackers can be helpful. Drinking liquids between meals instead of during meals also seems to help nausea and vomiting.  · If you develop constipation, eat more high-fiber foods, such as fresh vegetables or fruit and whole grains. Drink enough fluids to keep your urine clear or pale yellow.  Activity and Exercise  · Exercise only as directed by your health care provider. Exercising will help you:    Control your weight.    Stay in shape.    Be prepared for labor and delivery.  · Experiencing pain or cramping in the lower abdomen or low back is a good sign that you should stop exercising. Check with your health care provider  before continuing normal exercises.  · Try to avoid standing for long periods of time. Move your legs often if you must stand in one place for a long time.  · Avoid heavy lifting.  · Wear low-heeled shoes, and practice good posture.  · You may continue to have sex unless your health care provider directs you otherwise.  Relief of Pain or Discomfort  · Wear a good support bra for breast tenderness.    · Take warm sitz baths to soothe any pain or discomfort caused by hemorrhoids. Use hemorrhoid cream if your health care provider approves.    · Rest with your legs elevated if you have leg cramps or low back pain.  · If you develop varicose veins in your   legs, wear support hose. Elevate your feet for 15 minutes, 3-4 times a day. Limit salt in your diet.  Prenatal Care  · Schedule your prenatal visits by the twelfth week of pregnancy. They are usually scheduled monthly at first, then more often in the last 2 months before delivery.  · Write down your questions. Take them to your prenatal visits.  · Keep all your prenatal visits as directed by your health care provider.  Safety  · Wear your seat belt at all times when driving.  · Make a list of emergency phone numbers, including numbers for family, friends, the hospital, and police and fire departments.  General Tips  · Ask your health care provider for a referral to a local prenatal education class. Begin classes no later than at the beginning of month 6 of your pregnancy.  · Ask for help if you have counseling or nutritional needs during pregnancy. Your health care provider can offer advice or refer you to specialists for help with various needs.  · Do not use hot tubs, steam rooms, or saunas.  · Do not douche or use tampons or scented sanitary pads.  · Do not cross your legs for long periods of time.  · Avoid cat litter boxes and soil used by cats. These carry germs that can cause birth defects in the baby and possibly loss of the fetus by miscarriage or stillbirth.   · Avoid all smoking, herbs, alcohol, and medicines not prescribed by your health care provider. Chemicals in these affect the formation and growth of the baby.  · Do not use any tobacco products, including cigarettes, chewing tobacco, and electronic cigarettes. If you need help quitting, ask your health care provider. You may receive counseling support and other resources to help you quit.  · Schedule a dentist appointment. At home, brush your teeth with a soft toothbrush and be gentle when you floss.  SEEK MEDICAL CARE IF:   · You have dizziness.  · You have mild pelvic cramps, pelvic pressure, or nagging pain in the abdominal area.  · You have persistent nausea, vomiting, or diarrhea.  · You have a bad smelling vaginal discharge.  · You have pain with urination.  · You notice increased swelling in your face, hands, legs, or ankles.  SEEK IMMEDIATE MEDICAL CARE IF:   · You have a fever.  · You are leaking fluid from your vagina.  · You have spotting or bleeding from your vagina.  · You have severe abdominal cramping or pain.  · You have rapid weight gain or loss.  · You vomit blood or material that looks like coffee grounds.  · You are exposed to German measles and have never had them.  · You are exposed to fifth disease or chickenpox.  · You develop a severe headache.  · You have shortness of breath.  · You have any kind of trauma, such as from a fall or a car accident.     This information is not intended to replace advice given to you by your health care provider. Make sure you discuss any questions you have with your health care provider.     Document Released: 02/17/2001 Document Revised: 03/16/2014 Document Reviewed: 01/03/2013  Elsevier Interactive Patient Education ©2017 [employer]

## 2016-02-17 NOTE — MAU Provider Note (Signed)
History    First Provider Initiated Contact with Patient 02/17/16 2201      Chief Complaint:  Vaginal Bleeding; Abdominal Pain; and Back Pain   [name] [name] is  30 y.o. G2P0100 Patient's last menstrual period was 12/27/2015.Marland Kitchen Patient is here for light vaginal bleeding x 4 days. Had [place] 02/07/16 showed live SIUP measuring 6.1 weeks. Still deciding where she plans to get prenatal care. Had a consultation w/ a practice in [place], but lives in [place] and would like to go somewhere in town.  She is [redacted]w[redacted]d weeks gestation  by early ultrasound.    Since her last visit, the patient is with new complaint.     ROS Abdomin Pain: None Vaginal bleeding: spotting.   Passage of clots or tissue: None Dizziness: None   Physical Exam   BP 117/77   Pulse 84   Temp 98.6 F (37 C) (Oral)   Resp 18   Wt 130 lb (59 kg)   LMP 12/27/2015   SpO2 97%   BMI 22.31 kg/m  Constitutional: Well-nourished female in no apparent distress. No pallor Neuro: Alert and oriented 4 Cardiovascular: Normal rate Respiratory: Normal effort and rate Abdomen: Soft, nontender Gynecological Exam: Declined due to recent exam, light bleeding Labs: Results for orders placed or performed during the hospital encounter of 02/17/16 (from the past 24 hour(s))  Urinalysis, Routine w reflex microscopic   Collection Time: 02/17/16  5:50 PM  Result Value Ref Range   Color, Urine YELLOW YELLOW   APPearance CLEAR CLEAR   Specific Gravity, Urine 1.020 1.005 - 1.030   pH 6.0 5.0 - 8.0   Glucose, UA NEGATIVE NEGATIVE mg/dL   Hgb urine dipstick SMALL (A) NEGATIVE   Bilirubin Urine NEGATIVE NEGATIVE   Ketones, ur NEGATIVE NEGATIVE mg/dL   Protein, ur NEGATIVE NEGATIVE mg/dL   Nitrite NEGATIVE NEGATIVE   Leukocytes, UA NEGATIVE NEGATIVE   RBC / HPF 0-5 0 - 5 RBC/hpf   WBC, UA 0-5 0 - 5 WBC/hpf   Bacteria, UA NONE SEEN NONE SEEN   Squamous Epithelial / LPF 0-5 (A) NONE SEEN   Mucous PRESENT   CBC   Collection Time: 02/17/16   8:49 PM  Result Value Ref Range   WBC 8.0 4.0 - 10.5 K/uL   RBC 3.61 (L) 3.87 - 5.11 MIL/uL   Hemoglobin 11.7 (L) 12.0 - 15.0 g/dL   HCT 33.5 (L) 36.0 - 46.0 %   MCV 92.8 78.0 - 100.0 fL   MCH 32.4 26.0 - 34.0 pg   MCHC 34.9 30.0 - 36.0 g/dL   RDW 14.1 11.5 - 15.5 %   Platelets 245 150 - 400 K/uL   Blood type O positive  Ultrasound Studies:   Result Date: 02/17/2016 CLINICAL DATA:  Bleeding in the first trimester pregnancy EXAM: TRANSVAGINAL OB ULTRASOUND TECHNIQUE: Transvaginal ultrasound was performed for complete evaluation of the gestation as well as the maternal uterus, adnexal regions, and pelvic cul-de-sac. COMPARISON:  01/29/2016 FINDINGS: Intrauterine gestational sac: Single Yolk sac:  Visualized. Embryo:  Visualized. Cardiac Activity: Visualized. Heart Rate: 169 bpm CRL:   13.8  mm   7 w 4 d                  [place] EDC: 10/01/2016 Subchorionic hemorrhage:  None visualized. Maternal uterus/adnexae: Right ovary is unremarkable. Left ovary contains a corpus luteum measuring 1.4 x 1.2 x 1.2 cm. An anechoic simple cyst measuring 2.1 x 2 x 2 cm is also noted on the  left. IMPRESSION: Single live 7 week 4 day intrauterine gestation without perigestational hematoma identified. Electronically Signed   By: Ashley Royalty M.D.   On: 02/17/2016 21:35   MAU course/MDM: Pelvic ultrasound ordered  Vaginal bleeding in early pregnancy with single live intrauterine pregnancy and hemodynamically stable.  Assessment: First trimester bleeding Live intrauterine pregnancy  Plan: Discharge home in stable condition. Bleeding precautions Pelvic rest 1 week. Has list of providers. Follow-up Information    Obstetrician of your choice Follow up.   Why:  Start prenatal care       [place] Follow up.   Why:  as needed in emergencies Contact information: [address] [id number] [place] [phone]            Medication List    TAKE these medications   albuterol 108 (90 Base) MCG/ACT inhaler Commonly known as:  PROVENTIL HFA;VENTOLIN HFA Inhale 1 puff into the lungs every 6 (six) hours as needed for wheezing or shortness of breath.   CITRANATAL HARMONY 27-1-260 MG Caps   metoprolol tartrate 25 MG tablet Commonly known as:  LOPRESSOR Take 1 tablet (25 mg total) by mouth 2 (two) times daily.   propylthiouracil 50 MG tablet Commonly known as:  PTU       Manya Silvas, CNM 02/17/2016, 10:04 PM  2/3

## 2016-02-17 NOTE — MAU Note (Signed)
Been bleeding since THursday.  Started as brownish, about a dollar sized coin.  Has continued each day. Around 4:15 started having more of a flow, reddish burgundy.  Some cramping in low back and abd.

## 2016-03-09 DIAGNOSIS — O24419 Gestational diabetes mellitus in pregnancy, unspecified control: Secondary | ICD-10-CM

## 2016-03-09 DIAGNOSIS — F53 Postpartum depression: Secondary | ICD-10-CM

## 2016-03-09 DIAGNOSIS — O99345 Other mental disorders complicating the puerperium: Secondary | ICD-10-CM

## 2016-03-09 HISTORY — DX: Other mental disorders complicating the puerperium: O99.345

## 2016-03-09 HISTORY — DX: Gestational diabetes mellitus in pregnancy, unspecified control: O24.419

## 2016-03-09 HISTORY — DX: Postpartum depression: F53.0

## 2016-03-10 DIAGNOSIS — Z719 Counseling, unspecified: Secondary | ICD-10-CM | POA: Diagnosis not present

## 2016-03-20 DIAGNOSIS — E0500 Thyrotoxicosis with diffuse goiter without thyrotoxic crisis or storm: Secondary | ICD-10-CM | POA: Diagnosis not present

## 2016-03-27 DIAGNOSIS — L509 Urticaria, unspecified: Secondary | ICD-10-CM | POA: Diagnosis not present

## 2016-03-27 LAB — OB RESULTS CONSOLE HIV ANTIBODY (ROUTINE TESTING): HIV: NONREACTIVE

## 2016-03-27 LAB — OB RESULTS CONSOLE GC/CHLAMYDIA
Chlamydia: NEGATIVE
Gonorrhea: NEGATIVE

## 2016-03-27 LAB — OB RESULTS CONSOLE HEPATITIS B SURFACE ANTIGEN: Hepatitis B Surface Ag: NEGATIVE

## 2016-04-10 DIAGNOSIS — E0500 Thyrotoxicosis with diffuse goiter without thyrotoxic crisis or storm: Secondary | ICD-10-CM | POA: Diagnosis not present

## 2016-05-14 DIAGNOSIS — E0500 Thyrotoxicosis with diffuse goiter without thyrotoxic crisis or storm: Secondary | ICD-10-CM | POA: Diagnosis not present

## 2016-06-02 DIAGNOSIS — L299 Pruritus, unspecified: Secondary | ICD-10-CM | POA: Diagnosis not present

## 2016-06-08 DIAGNOSIS — E0500 Thyrotoxicosis with diffuse goiter without thyrotoxic crisis or storm: Secondary | ICD-10-CM | POA: Diagnosis not present

## 2016-07-06 DIAGNOSIS — E0500 Thyrotoxicosis with diffuse goiter without thyrotoxic crisis or storm: Secondary | ICD-10-CM | POA: Diagnosis not present

## 2016-07-13 ENCOUNTER — Inpatient Hospital Stay (HOSPITAL_COMMUNITY)
Admission: AD | Admit: 2016-07-13 | Discharge: 2016-07-14 | Disposition: A | Discharge status: Home / Self Care | Payer: 59 | Source: Ambulatory Visit | Attending: Obstetrics and Gynecology | Admitting: Obstetrics and Gynecology

## 2016-07-13 ENCOUNTER — Encounter (HOSPITAL_COMMUNITY): Payer: Self-pay | Admitting: *Deleted

## 2016-07-13 DIAGNOSIS — Z3A28 28 weeks gestation of pregnancy: Secondary | ICD-10-CM

## 2016-07-13 DIAGNOSIS — W010XXA Fall on same level from slipping, tripping and stumbling without subsequent striking against object, initial encounter: Secondary | ICD-10-CM | POA: Insufficient documentation

## 2016-07-13 DIAGNOSIS — W19XXXA Unspecified fall, initial encounter: Secondary | ICD-10-CM

## 2016-07-13 DIAGNOSIS — Z3A49 Greater than 42 weeks gestation of pregnancy: Secondary | ICD-10-CM | POA: Insufficient documentation

## 2016-07-13 DIAGNOSIS — Z3689 Encounter for other specified antenatal screening: Secondary | ICD-10-CM

## 2016-07-13 DIAGNOSIS — Y9301 Activity, walking, marching and hiking: Secondary | ICD-10-CM | POA: Insufficient documentation

## 2016-07-13 DIAGNOSIS — O26893 Other specified pregnancy related conditions, third trimester: Secondary | ICD-10-CM | POA: Insufficient documentation

## 2016-07-13 DIAGNOSIS — Z23 Encounter for immunization: Secondary | ICD-10-CM | POA: Diagnosis not present

## 2016-07-13 LAB — OB RESULTS CONSOLE HIV ANTIBODY (ROUTINE TESTING): HIV: NONREACTIVE

## 2016-07-13 NOTE — MAU Provider Note (Signed)
  History     CSN: [id number]  Arrival date and time: 07/13/16 2116   First Provider Initiated Contact with Patient 07/13/16 2242      Chief Complaint  Patient presents with  . Fall   [name] [name] is a 31 y.o. G2P0100 at [redacted]w[redacted]d who presents today after a fall. She states that around 2030 she slipped and landed on her side. She states there was not direct abdominal impact. She denies any VB or LOF. She reports normal fetal movement.    Fall  The accident occurred 1 to 3 hours ago. The fall occurred while walking. She fell from a height of 3 to 5 ft. She landed on hard floor. There was no blood loss. The point of impact was the right shoulder and right hip. The pain is at a severity of 0/10. The patient is experiencing no pain.    Past Medical History:  Diagnosis Date  . Asthma   . Graves disease     Past Surgical History:  Procedure Laterality Date  . WISDOM TOOTH EXTRACTION      Family History  Problem Relation Age of Onset  . Cancer Maternal Grandmother   . Heart disease Maternal Grandmother     Social History  Substance Use Topics  . Smoking status: Never Smoker  . Smokeless tobacco: Never Used  . Alcohol use No    Allergies: No Known Allergies  Prescriptions Prior to Admission  Medication Sig Dispense Refill Last Dose  . Prenatal Vit-Fe Fumarate-FA (PRENATAL MULTIVITAMIN) TABS tablet Take 1 tablet by mouth daily at 12 noon.   07/13/2016 at Unknown time  . albuterol (PROVENTIL HFA;VENTOLIN HFA) 108 (90 BASE) MCG/ACT inhaler Inhale 1 puff into the lungs every 6 (six) hours as needed for wheezing or shortness of breath.   Rescue    Review of Systems Physical Exam   Blood pressure 102/61, pulse 88, temperature 98 F (36.7 C), resp. rate 16, last menstrual period 12/27/2015.  Physical Exam  Nursing note and vitals reviewed. Constitutional: She is oriented to person, place, and time. She appears well-developed and well-nourished. No distress.  HENT:  Head:  Normocephalic.  Cardiovascular: Normal rate.   Respiratory: Effort normal.  GI: Soft. There is no tenderness. There is no rebound.  Neurological: She is alert and oriented to person, place, and time.  Skin: Skin is warm and dry.  Psychiatric: She has a normal mood and affect.   FHT: 130, moderate with 10x10 accels, no decels (appropriate for GA) Toco: 2 UCs when first placed on the monitor. (at 2226 and 2248). MAU Course  Procedures  MDM 0017: D/W Dr. Melba Coon, will monitor for 4 hours total.  0238: no further contractions noted.  Assessment and Plan   1. Fall, initial encounter   2. [redacted] weeks gestation of pregnancy   3. NST (non-stress test) reactive    DC home Comfort measures reviewed  3rd Trimester precautions  Bleeding precautions PTL precautions  Fetal kick counts RX: none  Return to MAU as needed FU with OB as planned  Follow-up Information    Bovard-Stuckert, Jody, MD Follow up.   Specialty:  Obstetrics and Gynecology Contact information: [place] [id number] [place] 07/13/2016, 10:44 PM

## 2016-07-13 NOTE — MAU Note (Signed)
Urine sent to lab 

## 2016-07-13 NOTE — MAU Note (Signed)
Pt reports that she fell on her ride side at 2034 pm while tripping over her pant leg. Denies lof or bleeding or contractions. Pt denies pain.

## 2016-07-14 DIAGNOSIS — W010XXA Fall on same level from slipping, tripping and stumbling without subsequent striking against object, initial encounter: Secondary | ICD-10-CM | POA: Diagnosis not present

## 2016-07-14 DIAGNOSIS — Z3A49 Greater than 42 weeks gestation of pregnancy: Secondary | ICD-10-CM | POA: Diagnosis not present

## 2016-07-14 DIAGNOSIS — W19XXXA Unspecified fall, initial encounter: Secondary | ICD-10-CM | POA: Diagnosis not present

## 2016-07-14 DIAGNOSIS — O26893 Other specified pregnancy related conditions, third trimester: Secondary | ICD-10-CM | POA: Diagnosis present

## 2016-07-14 DIAGNOSIS — O9989 Other specified diseases and conditions complicating pregnancy, childbirth and the puerperium: Secondary | ICD-10-CM | POA: Diagnosis not present

## 2016-07-14 DIAGNOSIS — Y9301 Activity, walking, marching and hiking: Secondary | ICD-10-CM | POA: Diagnosis not present

## 2016-07-14 NOTE — Discharge Instructions (Signed)
Third Trimester of Pregnancy The third trimester is from week 28 through week 40 (months 7 through 9). The third trimester is a time when the unborn baby (fetus) is growing rapidly. At the end of the ninth month, the fetus is about 20 inches in length and weighs 6-10 pounds. Body changes during your third trimester Your body will continue to go through many changes during pregnancy. The changes vary from woman to woman. During the third trimester:  Your weight will continue to increase. You can expect to gain 25-35 pounds (11-16 kg) by the end of the pregnancy.  You may begin to get stretch marks on your hips, abdomen, and breasts.  You may urinate more often because the fetus is moving lower into your pelvis and pressing on your bladder.  You may develop or continue to have heartburn. This is caused by increased hormones that slow down muscles in the digestive tract.  You may develop or continue to have constipation because increased hormones slow digestion and cause the muscles that push waste through your intestines to relax.  You may develop hemorrhoids. These are swollen veins (varicose veins) in the rectum that can itch or be painful.  You may develop swollen, bulging veins (varicose veins) in your legs.  You may have increased body aches in the pelvis, back, or thighs. This is due to weight gain and increased hormones that are relaxing your joints.  You may have changes in your hair. These can include thickening of your hair, rapid growth, and changes in texture. Some women also have hair loss during or after pregnancy, or hair that feels dry or thin. Your hair will most likely return to normal after your baby is born.  Your breasts will continue to grow and they will continue to become tender. A yellow fluid (colostrum) may leak from your breasts. This is the first milk you are producing for your baby.  Your belly button may stick out.  You may notice more swelling in your hands,  face, or ankles.  You may have increased tingling or numbness in your hands, arms, and legs. The skin on your belly may also feel numb.  You may feel short of breath because of your expanding uterus.  You may have more problems sleeping. This can be caused by the size of your belly, increased need to urinate, and an increase in your body's metabolism.  You may notice the fetus "dropping," or moving lower in your abdomen (lightening).  You may have increased vaginal discharge.  You may notice your joints feel loose and you may have pain around your pelvic bone.  What to expect at prenatal visits You will have prenatal exams every 2 weeks until week 36. Then you will have weekly prenatal exams. During a routine prenatal visit:  You will be weighed to make sure you and the baby are growing normally.  Your blood pressure will be taken.  Your abdomen will be measured to track your baby's growth.  The fetal heartbeat will be listened to.  Any test results from the previous visit will be discussed.  You may have a cervical check near your due date to see if your cervix has softened or thinned (effaced).  You will be tested for Group B streptococcus. This happens between 35 and 37 weeks.  Your health care provider may ask you:  What your birth plan is.  How you are feeling.  If you are feeling the baby move.  If you have had   any abnormal symptoms, such as leaking fluid, bleeding, severe headaches, or abdominal cramping.  If you are using any tobacco products, including cigarettes, chewing tobacco, and electronic cigarettes.  If you have any questions.  Other tests or screenings that may be performed during your third trimester include:  Blood tests that check for low iron levels (anemia).  Fetal testing to check the health, activity level, and growth of the fetus. Testing is done if you have certain medical conditions or if there are problems during the  pregnancy.  Nonstress test (NST). This test checks the health of your baby to make sure there are no signs of problems, such as the baby not getting enough oxygen. During this test, a belt is placed around your belly. The baby is made to move, and its heart rate is monitored during movement.  What is false labor? False labor is a condition in which you feel small, irregular tightenings of the muscles in the womb (contractions) that usually go away with rest, changing position, or drinking water. These are called Braxton Hicks contractions. Contractions may last for hours, days, or even weeks before true labor sets in. If contractions come at regular intervals, become more frequent, increase in intensity, or become painful, you should see your health care provider. What are the signs of labor?  Abdominal cramps.  Regular contractions that start at 10 minutes apart and become stronger and more frequent with time.  Contractions that start on the top of the uterus and spread down to the lower abdomen and back.  Increased pelvic pressure and dull back pain.  A watery or bloody mucus discharge that comes from the vagina.  Leaking of amniotic fluid. This is also known as your "water breaking." It could be a slow trickle or a gush. Let your health care provider know if it has a color or strange odor. If you have any of these signs, call your health care provider right away, even if it is before your due date. Follow these instructions at home: Medicines  Follow your health care provider's instructions regarding medicine use. Specific medicines may be either safe or unsafe to take during pregnancy.  Take a prenatal vitamin that contains at least 600 micrograms (mcg) of folic acid.  If you develop constipation, try taking a stool softener if your health care provider approves. Eating and drinking  Eat a balanced diet that includes fresh fruits and vegetables, whole grains, good sources of protein  such as meat, eggs, or tofu, and low-fat dairy. Your health care provider will help you determine the amount of weight gain that is right for you.  Avoid raw meat and uncooked cheese. These carry germs that can cause birth defects in the baby.  If you have low calcium intake from food, talk to your health care provider about whether you should take a daily calcium supplement.  Eat four or five small meals rather than three large meals a day.  Limit foods that are high in fat and processed sugars, such as fried and sweet foods.  To prevent constipation: ? Drink enough fluid to keep your urine clear or pale yellow. ? Eat foods that are high in fiber, such as fresh fruits and vegetables, whole grains, and beans. Activity  Exercise only as directed by your health care provider. Most women can continue their usual exercise routine during pregnancy. Try to exercise for 30 minutes at least 5 days a week. Stop exercising if you experience uterine contractions.  Avoid heavy   lifting.  Do not exercise in extreme heat or humidity, or at high altitudes.  Wear low-heel, comfortable shoes.  Practice good posture.  You may continue to have sex unless your health care provider tells you otherwise. Relieving pain and discomfort  Take frequent breaks and rest with your legs elevated if you have leg cramps or low back pain.  Take warm sitz baths to soothe any pain or discomfort caused by hemorrhoids. Use hemorrhoid cream if your health care provider approves.  Wear a good support bra to prevent discomfort from breast tenderness.  If you develop varicose veins: ? Wear support pantyhose or compression stockings as told by your healthcare provider. ? Elevate your feet for 15 minutes, 3-4 times a day. Prenatal care  Write down your questions. Take them to your prenatal visits.  Keep all your prenatal visits as told by your health care provider. This is important. Safety  Wear your seat belt at  all times when driving.  Make a list of emergency phone numbers, including numbers for family, friends, the hospital, and police and fire departments. General instructions  Avoid cat litter boxes and soil used by cats. These carry germs that can cause birth defects in the baby. If you have a cat, ask someone to clean the litter box for you.  Do not travel far distances unless it is absolutely necessary and only with the approval of your health care provider.  Do not use hot tubs, steam rooms, or saunas.  Do not drink alcohol.  Do not use any products that contain nicotine or tobacco, such as cigarettes and e-cigarettes. If you need help quitting, ask your health care provider.  Do not use any medicinal herbs or unprescribed drugs. These chemicals affect the formation and growth of the baby.  Do not douche or use tampons or scented sanitary pads.  Do not cross your legs for long periods of time.  To prepare for the arrival of your baby: ? Take prenatal classes to understand, practice, and ask questions about labor and delivery. ? Make a trial run to the hospital. ? Visit the hospital and tour the maternity area. ? Arrange for maternity or paternity leave through employers. ? Arrange for family and friends to take care of pets while you are in the hospital. ? Purchase a rear-facing car seat and make sure you know how to install it in your car. ? Pack your hospital bag. ? Prepare the baby's nursery. Make sure to remove all pillows and stuffed animals from the baby's crib to prevent suffocation.  Visit your dentist if you have not gone during your pregnancy. Use a soft toothbrush to brush your teeth and be gentle when you floss. Contact a health care provider if:  You are unsure if you are in labor or if your water has broken.  You become dizzy.  You have mild pelvic cramps, pelvic pressure, or nagging pain in your abdominal area.  You have lower back pain.  You have persistent  nausea, vomiting, or diarrhea.  You have an unusual or bad smelling vaginal discharge.  You have pain when you urinate. Get help right away if:  Your water breaks before 37 weeks.  You have regular contractions less than 5 minutes apart before 37 weeks.  You have a fever.  You are leaking fluid from your vagina.  You have spotting or bleeding from your vagina.  You have severe abdominal pain or cramping.  You have rapid weight loss or weight gain.    You have shortness of breath with chest pain.  You notice sudden or extreme swelling of your face, hands, ankles, feet, or legs.  Your baby makes fewer than 10 movements in 2 hours.  You have severe headaches that do not go away when you take medicine.  You have vision changes. Summary  The third trimester is from week 28 through week 40, months 7 through 9. The third trimester is a time when the unborn baby (fetus) is growing rapidly.  During the third trimester, your discomfort may increase as you and your baby continue to gain weight. You may have abdominal, leg, and back pain, sleeping problems, and an increased need to urinate.  During the third trimester your breasts will keep growing and they will continue to become tender. A yellow fluid (colostrum) may leak from your breasts. This is the first milk you are producing for your baby.  False labor is a condition in which you feel small, irregular tightenings of the muscles in the womb (contractions) that eventually go away. These are called Braxton Hicks contractions. Contractions may last for hours, days, or even weeks before true labor sets in.  Signs of labor can include: abdominal cramps; regular contractions that start at 10 minutes apart and become stronger and more frequent with time; watery or bloody mucus discharge that comes from the vagina; increased pelvic pressure and dull back pain; and leaking of amniotic fluid. This information is not intended to replace advice  given to you by your health care provider. Make sure you discuss any questions you have with your health care provider. Document Released: 02/17/2001 Document Revised: 08/01/2015 Document Reviewed: 04/26/2012 Elsevier Interactive Patient Education  2017 [employer]  

## 2016-07-31 DIAGNOSIS — E0500 Thyrotoxicosis with diffuse goiter without thyrotoxic crisis or storm: Secondary | ICD-10-CM | POA: Diagnosis not present

## 2016-08-05 ENCOUNTER — Ambulatory Visit: Payer: 59

## 2016-09-10 DIAGNOSIS — E0500 Thyrotoxicosis with diffuse goiter without thyrotoxic crisis or storm: Secondary | ICD-10-CM | POA: Diagnosis not present

## 2016-09-13 ENCOUNTER — Encounter (HOSPITAL_COMMUNITY)
Admission: AD | Disposition: A | Discharge status: Home / Self Care | Payer: Self-pay | Source: Ambulatory Visit | Attending: Obstetrics and Gynecology

## 2016-09-13 ENCOUNTER — Inpatient Hospital Stay (HOSPITAL_COMMUNITY)
Admission: AD | Admit: 2016-09-13 | Discharge: 2016-09-16 | DRG: 766 | Disposition: A | Discharge status: Home / Self Care | Payer: 59 | Source: Ambulatory Visit | Attending: Obstetrics and Gynecology | Admitting: Obstetrics and Gynecology

## 2016-09-13 ENCOUNTER — Encounter (HOSPITAL_COMMUNITY): Payer: Self-pay

## 2016-09-13 ENCOUNTER — Inpatient Hospital Stay (HOSPITAL_COMMUNITY): Payer: 59 | Admitting: Anesthesiology

## 2016-09-13 DIAGNOSIS — O9952 Diseases of the respiratory system complicating childbirth: Secondary | ICD-10-CM | POA: Diagnosis present

## 2016-09-13 DIAGNOSIS — E0590 Thyrotoxicosis, unspecified without thyrotoxic crisis or storm: Secondary | ICD-10-CM | POA: Diagnosis present

## 2016-09-13 DIAGNOSIS — J45909 Unspecified asthma, uncomplicated: Secondary | ICD-10-CM | POA: Diagnosis present

## 2016-09-13 DIAGNOSIS — O4693 Antepartum hemorrhage, unspecified, third trimester: Secondary | ICD-10-CM | POA: Diagnosis not present

## 2016-09-13 DIAGNOSIS — O24420 Gestational diabetes mellitus in childbirth, diet controlled: Secondary | ICD-10-CM | POA: Diagnosis present

## 2016-09-13 DIAGNOSIS — O328XX0 Maternal care for other malpresentation of fetus, not applicable or unspecified: Secondary | ICD-10-CM | POA: Diagnosis not present

## 2016-09-13 DIAGNOSIS — Z98891 History of uterine scar from previous surgery: Secondary | ICD-10-CM

## 2016-09-13 DIAGNOSIS — Z3A37 37 weeks gestation of pregnancy: Secondary | ICD-10-CM

## 2016-09-13 DIAGNOSIS — O99284 Endocrine, nutritional and metabolic diseases complicating childbirth: Secondary | ICD-10-CM | POA: Diagnosis present

## 2016-09-13 DIAGNOSIS — Z8759 Personal history of other complications of pregnancy, childbirth and the puerperium: Secondary | ICD-10-CM

## 2016-09-13 LAB — CBC
HEMATOCRIT: 38.4 % (ref 36.0–46.0)
HEMOGLOBIN: 12.9 g/dL (ref 12.0–15.0)
MCH: 34.1 pg — AB (ref 26.0–34.0)
MCHC: 33.6 g/dL (ref 30.0–36.0)
MCV: 101.6 fL — AB (ref 78.0–100.0)
Platelets: 206 10*3/uL (ref 150–400)
RBC: 3.78 MIL/uL — ABNORMAL LOW (ref 3.87–5.11)
RDW: 15.1 % (ref 11.5–15.5)
WBC: 11.4 10*3/uL — ABNORMAL HIGH (ref 4.0–10.5)

## 2016-09-13 LAB — URINALYSIS, ROUTINE W REFLEX MICROSCOPIC
Bilirubin Urine: NEGATIVE
Glucose, UA: NEGATIVE mg/dL
Hgb urine dipstick: NEGATIVE
KETONES UR: 5 mg/dL — AB
LEUKOCYTES UA: NEGATIVE
NITRITE: NEGATIVE
Protein, ur: NEGATIVE mg/dL
Specific Gravity, Urine: 1.005 (ref 1.005–1.030)
pH: 7 (ref 5.0–8.0)

## 2016-09-13 LAB — TYPE AND SCREEN
ABO/RH(D): O POS
Antibody Screen: NEGATIVE

## 2016-09-13 LAB — GLUCOSE, CAPILLARY
GLUCOSE-CAPILLARY: 68 mg/dL (ref 65–99)
GLUCOSE-CAPILLARY: 74 mg/dL (ref 65–99)
Glucose-Capillary: 70 mg/dL (ref 65–99)

## 2016-09-13 SURGERY — Surgical Case
Anesthesia: Spinal

## 2016-09-13 MED ORDER — PROMETHAZINE HCL 25 MG/ML IJ SOLN: 6.2500 mg | INTRAMUSCULAR | Status: DC | PRN

## 2016-09-13 MED ORDER — BUTORPHANOL TARTRATE 1 MG/ML IJ SOLN: 1.0000 mg | INTRAMUSCULAR | Status: DC | PRN

## 2016-09-13 MED ORDER — MORPHINE SULFATE (PF) 0.5 MG/ML IJ SOLN
INTRAMUSCULAR | Status: AC
  Filled 2016-09-13: qty 10

## 2016-09-13 MED ORDER — SCOPOLAMINE 1 MG/3DAYS TD PT72
MEDICATED_PATCH | TRANSDERMAL | Status: DC | PRN
  Administered 2016-09-13: 1 via TRANSDERMAL

## 2016-09-13 MED ORDER — OXYTOCIN BOLUS FROM INFUSION: 500.0000 mL | Freq: Once | INTRAVENOUS | Status: DC

## 2016-09-13 MED ORDER — EPHEDRINE 5 MG/ML INJ
10.0000 mg | INTRAVENOUS | Status: DC | PRN
Start: 2016-09-13 — End: 2016-09-14

## 2016-09-13 MED ORDER — DEXAMETHASONE SODIUM PHOSPHATE 4 MG/ML IJ SOLN
INTRAMUSCULAR | Status: DC | PRN
  Administered 2016-09-13: 4 mg via INTRAVENOUS

## 2016-09-13 MED ORDER — PHENYLEPHRINE HCL 10 MG/ML IJ SOLN
INTRAMUSCULAR | Status: DC | PRN
  Administered 2016-09-13: 40 ug via INTRAVENOUS
  Administered 2016-09-13 (×3): 120 ug via INTRAVENOUS
  Administered 2016-09-13: 80 ug via INTRAVENOUS
  Administered 2016-09-13: 120 ug via INTRAVENOUS
  Administered 2016-09-13: 80 ug via INTRAVENOUS

## 2016-09-13 MED ORDER — LACTATED RINGERS IV SOLN: 500.0000 mL | INTRAVENOUS | Status: DC | PRN

## 2016-09-13 MED ORDER — SODIUM CHLORIDE 0.9 % IR SOLN
Status: DC | PRN
  Administered 2016-09-13: 200 mL

## 2016-09-13 MED ORDER — ONDANSETRON HCL 4 MG/2ML IJ SOLN
INTRAMUSCULAR | Status: DC | PRN
  Administered 2016-09-13: 4 mg via INTRAVENOUS

## 2016-09-13 MED ORDER — OXYCODONE HCL 5 MG/5ML PO SOLN: 5.0000 mg | Freq: Once | ORAL | Status: DC | PRN

## 2016-09-13 MED ORDER — MORPHINE SULFATE (PF) 0.5 MG/ML IJ SOLN
INTRAMUSCULAR | Status: DC | PRN
Start: 2016-09-13 — End: 2016-09-13
  Administered 2016-09-13: .2 mg via INTRATHECAL

## 2016-09-13 MED ORDER — HYDROMORPHONE HCL 1 MG/ML IJ SOLN: 0.2500 mg | INTRAMUSCULAR | Status: DC | PRN

## 2016-09-13 MED ORDER — ACETAMINOPHEN 325 MG PO TABS: 650.0000 mg | ORAL_TABLET | ORAL | Status: DC | PRN

## 2016-09-13 MED ORDER — CEFAZOLIN SODIUM-DEXTROSE 2-3 GM-% IV SOLR
INTRAVENOUS | Status: DC | PRN
  Administered 2016-09-13: 2 g via INTRAVENOUS

## 2016-09-13 MED ORDER — PHENYLEPHRINE 8 MG IN D5W 100 ML (0.08MG/ML) PREMIX OPTIME
INJECTION | INTRAVENOUS | Status: DC | PRN
  Administered 2016-09-13: 60 ug/min via INTRAVENOUS

## 2016-09-13 MED ORDER — DIPHENHYDRAMINE HCL 50 MG/ML IJ SOLN: 12.5000 mg | INTRAMUSCULAR | Status: DC | PRN

## 2016-09-13 MED ORDER — OXYTOCIN 40 UNITS IN LACTATED RINGERS INFUSION - SIMPLE MED: 2.5000 [IU]/h | INTRAVENOUS | Status: DC

## 2016-09-13 MED ORDER — SOD CITRATE-CITRIC ACID 500-334 MG/5ML PO SOLN
30.0000 mL | ORAL | Status: DC | PRN
  Administered 2016-09-13: 30 mL via ORAL
  Filled 2016-09-13: qty 15

## 2016-09-13 MED ORDER — LIDOCAINE HCL (PF) 1 % IJ SOLN: 30.0000 mL | INTRAMUSCULAR | Status: DC | PRN

## 2016-09-13 MED ORDER — FENTANYL CITRATE (PF) 100 MCG/2ML IJ SOLN
INTRAMUSCULAR | Status: DC | PRN
  Administered 2016-09-13: 10 ug via INTRATHECAL

## 2016-09-13 MED ORDER — OXYCODONE HCL 5 MG PO TABS: 5.0000 mg | ORAL_TABLET | Freq: Once | ORAL | Status: DC | PRN

## 2016-09-13 MED ORDER — OXYTOCIN 40 UNITS IN LACTATED RINGERS INFUSION - SIMPLE MED: 1.0000 m[IU]/min | INTRAVENOUS | Status: DC

## 2016-09-13 MED ORDER — FENTANYL CITRATE (PF) 100 MCG/2ML IJ SOLN
INTRAMUSCULAR | Status: AC
  Filled 2016-09-13: qty 2

## 2016-09-13 MED ORDER — KETOROLAC TROMETHAMINE 30 MG/ML IJ SOLN: 30.0000 mg | Freq: Once | INTRAMUSCULAR | Status: DC | PRN

## 2016-09-13 MED ORDER — LACTATED RINGERS IV SOLN: 500.0000 mL | Freq: Once | INTRAVENOUS | Status: DC

## 2016-09-13 MED ORDER — EPHEDRINE 5 MG/ML INJ: 10.0000 mg | INTRAVENOUS | Status: DC | PRN

## 2016-09-13 MED ORDER — ONDANSETRON HCL 4 MG/2ML IJ SOLN: 4.0000 mg | Freq: Four times a day (QID) | INTRAMUSCULAR | Status: DC | PRN

## 2016-09-13 MED ORDER — FENTANYL 2.5 MCG/ML BUPIVACAINE 1/10 % EPIDURAL INFUSION (WH - ANES): 14.0000 mL/h | INTRAMUSCULAR | Status: DC | PRN

## 2016-09-13 MED ORDER — OXYCODONE-ACETAMINOPHEN 5-325 MG PO TABS: 2.0000 | ORAL_TABLET | ORAL | Status: DC | PRN

## 2016-09-13 MED ORDER — LACTATED RINGERS IV SOLN
INTRAVENOUS | Status: DC
  Administered 2016-09-13 (×4): via INTRAVENOUS

## 2016-09-13 MED ORDER — OXYTOCIN 10 UNIT/ML IJ SOLN
INTRAVENOUS | Status: DC | PRN
  Administered 2016-09-13: 40 [IU] via INTRAVENOUS

## 2016-09-13 MED ORDER — PHENYLEPHRINE 40 MCG/ML (10ML) SYRINGE FOR IV PUSH (FOR BLOOD PRESSURE SUPPORT): 80.0000 ug | PREFILLED_SYRINGE | INTRAVENOUS | Status: DC | PRN

## 2016-09-13 MED ORDER — TERBUTALINE SULFATE 1 MG/ML IJ SOLN: 0.2500 mg | Freq: Once | INTRAMUSCULAR | Status: DC | PRN

## 2016-09-13 MED ORDER — OXYCODONE-ACETAMINOPHEN 5-325 MG PO TABS: 1.0000 | ORAL_TABLET | ORAL | Status: DC | PRN

## 2016-09-13 MED ORDER — BUPIVACAINE IN DEXTROSE 0.75-8.25 % IT SOLN
INTRATHECAL | Status: DC | PRN
  Administered 2016-09-13: 1.6 mL via INTRATHECAL

## 2016-09-13 SURGICAL SUPPLY — 38 items
BENZOIN TINCTURE PRP APPL 2/3 (GAUZE/BANDAGES/DRESSINGS) ×3 IMPLANT
CHLORAPREP W/TINT 26ML (MISCELLANEOUS) ×3 IMPLANT
CLAMP CORD UMBIL (MISCELLANEOUS) IMPLANT
CLOSURE STERI STRIP 1/2 X4 (GAUZE/BANDAGES/DRESSINGS) ×2 IMPLANT
CLOSURE WOUND 1/2 X4 (GAUZE/BANDAGES/DRESSINGS) ×1
CLOTH BEACON ORANGE TIMEOUT ST (SAFETY) ×3 IMPLANT
CONTAINER PREFILL 10% NBF 15ML (MISCELLANEOUS) IMPLANT
DRSG OPSITE POSTOP 4X10 (GAUZE/BANDAGES/DRESSINGS) ×3 IMPLANT
ELECT REM PT RETURN 9FT ADLT (ELECTROSURGICAL) ×3
ELECTRODE REM PT RTRN 9FT ADLT (ELECTROSURGICAL) ×1 IMPLANT
EXTRACTOR VACUUM M CUP 4 TUBE (SUCTIONS) IMPLANT
EXTRACTOR VACUUM M CUP 4' TUBE (SUCTIONS)
GLOVE BIO SURGEON STRL SZ 6.5 (GLOVE) ×2 IMPLANT
GLOVE BIO SURGEONS STRL SZ 6.5 (GLOVE) ×1
GLOVE BIOGEL PI IND STRL 7.0 (GLOVE) ×1 IMPLANT
GLOVE BIOGEL PI INDICATOR 7.0 (GLOVE) ×2
GOWN STRL REUS W/TWL LRG LVL3 (GOWN DISPOSABLE) ×6 IMPLANT
KIT ABG SYR 3ML LUER SLIP (SYRINGE) IMPLANT
NEEDLE HYPO 25X5/8 SAFETYGLIDE (NEEDLE) IMPLANT
NS IRRIG 1000ML POUR BTL (IV SOLUTION) ×3 IMPLANT
PACK C SECTION WH (CUSTOM PROCEDURE TRAY) ×3 IMPLANT
PAD ABD 7.5X8 STRL (GAUZE/BANDAGES/DRESSINGS) ×3 IMPLANT
PAD OB MATERNITY 4.3X12.25 (PERSONAL CARE ITEMS) ×3 IMPLANT
PENCIL SMOKE EVAC W/HOLSTER (ELECTROSURGICAL) ×3 IMPLANT
RTRCTR C-SECT PINK 25CM LRG (MISCELLANEOUS) ×3 IMPLANT
STRIP CLOSURE SKIN 1/2X4 (GAUZE/BANDAGES/DRESSINGS) ×2 IMPLANT
SUT MNCRL 0 VIOLET CTX 36 (SUTURE) ×2 IMPLANT
SUT MONOCRYL 0 CTX 36 (SUTURE) ×4
SUT PLAIN 1 NONE 54 (SUTURE) IMPLANT
SUT PLAIN 2 0 XLH (SUTURE) ×3 IMPLANT
SUT VIC AB 0 CT1 27 (SUTURE) ×4
SUT VIC AB 0 CT1 27XBRD ANBCTR (SUTURE) ×2 IMPLANT
SUT VIC AB 2-0 CT1 27 (SUTURE) ×2
SUT VIC AB 2-0 CT1 TAPERPNT 27 (SUTURE) ×1 IMPLANT
SUT VIC AB 4-0 KS 27 (SUTURE) ×3 IMPLANT
SYR BULB IRRIGATION 50ML (SYRINGE) ×3 IMPLANT
TOWEL OR 17X24 6PK STRL BLUE (TOWEL DISPOSABLE) ×3 IMPLANT
TRAY FOLEY BAG SILVER LF 14FR (SET/KITS/TRAYS/PACK) ×3 IMPLANT

## 2016-09-13 NOTE — Consult Note (Signed)
Neonatology Consult to Antenatal Patient:  I was asked by Dr. Melba Coon to see this patient in order to provide antenatal counseling due to GDM, onset of labor at 50 2/7 weeks, and previous IUFD at 25 weeks.  Ms. Kimbell was admitted today at 66 2/[redacted] weeks GA. She is currently having active labor. She is GBS negative. Diabetes has been well-controlled with diet. The baby is female. Mother plans to breast feed.  I spoke with the patient and her husband. We discussed what to expect at delivery, including  possible respiratory complications and need for support due to a slightly elevated risk for respiratory distress due to mother's GDM and white female infant. We also discussed the risk for hypoglycemia, and I recommended that, after breast feeding, the baby be offered a formula supplement for the first few feedings, especially if the first blood glucose check is marginal or low. This could lower the chance of the baby requiring NICU admission. I reassured them that the potential problems of respiratory distress and hypoglycemia are treatable and that our NICU staff will be here to assist, if needed. I answered their questions and would be glad to come back if they have more questions later.  Thank you for asking me to see this patient.  Real Cons, MD Neonatologist  The total length of face-to-face or floor/unit time for this encounter was 20 minutes. Counseling and/or coordination of care was 12 minutes of the above.

## 2016-09-13 NOTE — Anesthesia Preprocedure Evaluation (Signed)
Anesthesia Evaluation  Patient identified by MRN, date of birth, ID band Patient awake    Reviewed: Allergy & Precautions, NPO status , Patient's Chart, lab work & pertinent test results  Airway Mallampati: II  TM Distance: >3 FB Neck ROM: Full    Dental no notable dental hx.    Pulmonary neg pulmonary ROS,    Pulmonary exam normal breath sounds clear to auscultation       Cardiovascular negative cardio ROS Normal cardiovascular exam Rhythm:Regular Rate:Normal     Neuro/Psych negative neurological ROS  negative psych ROS   GI/Hepatic negative GI ROS, Neg liver ROS,   Endo/Other  negative endocrine ROS  Renal/GU negative Renal ROS  negative genitourinary   Musculoskeletal negative musculoskeletal ROS (+)   Abdominal   Peds negative pediatric ROS (+)  Hematology negative hematology ROS (+)   Anesthesia Other Findings   Reproductive/Obstetrics negative OB ROS (+) Pregnancy                             Anesthesia Physical Anesthesia Plan  ASA: II and emergent  Anesthesia Plan: Spinal   Post-op Pain Management:    Induction:   PONV Risk Score and Plan: 2 and Ondansetron and Treatment may vary due to age or medical condition  Airway Management Planned: Natural Airway  Additional Equipment:   Intra-op Plan:   Post-operative Plan:   Informed Consent: I have reviewed the patients History and Physical, chart, labs and discussed the procedure including the risks, benefits and alternatives for the proposed anesthesia with the patient or authorized representative who has indicated his/her understanding and acceptance.   Dental advisory given  Plan Discussed with: CRNA  Anesthesia Plan Comments:         Anesthesia Quick Evaluation

## 2016-09-13 NOTE — Brief Op Note (Signed)
09/13/2016  11:10 PM  PATIENT:  [name] [name]  31 y.o. female  PRE-OPERATIVE DIAGNOSIS:  CESAREAN SECTION, malpresentation  POST-OPERATIVE DIAGNOSIS:  CESAREAN SECTION Malpresentation, delivered  PROCEDURE:  Procedure(s): CESAREAN SECTION (N/A)  SURGEON:  Surgeon(s) and Role:    * Bovard-Stuckert, Jazell Rosenau, MD - Primary  FINDINGS: viable female infant at 22:15, apgars 9,9 wt P; nl uterus, tubes and ovaries.    ANESTHESIA:   spinal  EBL:  Total I/O In: 2300 [I.V.:2300] Out: 775 [Urine:75; Blood:700]  BLOOD ADMINISTERED:none  DRAINS: Urinary Catheter (Foley)   LOCAL MEDICATIONS USED:  NONE  SPECIMEN:  Source of Specimen:  Placenta  DISPOSITION OF SPECIMEN:  L&D  COUNTS:  YES  TOURNIQUET:  * No tourniquets in log *  DICTATION: .Other Dictation: Dictation Number [id number]  PLAN OF CARE: Admit to inpatient   PATIENT DISPOSITION:  PACU - hemodynamically stable.   Delay start of Pharmacological VTE agent (>24hrs) due to surgical blood loss or risk of bleeding: not applicable

## 2016-09-13 NOTE — Consult Note (Signed)
Neonatology Note:   Attendance at C-section:    I was asked by Dr. Sandford Craze to attend this primary C/S at 37 2/7 weeks due to malpresentation. The mother is a G2P0 O pos, GBS neg with diet-controlled GDM and hyperthyroidism. ROM 1 hour prior to delivery, fluid clear. Infant vigorous with good spontaneous cry and tone. Delayed cord clamping was done. Needed only minimal bulb suctioning. Ap 9/9. Lungs clear to ausc in DR, O2 saturation 96% in room air at about 7 minutes of life. To CN to care of Pediatrician.   Real Cons, MD

## 2016-09-13 NOTE — H&P (Signed)
[name] [name] is a 31 y.o. female G2P0100 at 49+ with BRB per vagina.  Pt with history of IUFD, unexplained at 36 weeks.  This patient has been treated for hyperthyroidism and GDM.  Has had low risk panorama, female.  Received Tdap 07/13/16.   OB History    Gravida Para Term Preterm AB Living   2 1   1    0   SAB TAB Ectopic Multiple Live Births         0      G1 36 wk 5#5, IUFD G2 present  No abn pap No STD  Past Medical History:  Diagnosis Date  . Asthma   . Graves disease   GDM  Past Surgical History:  Procedure Laterality Date  . WISDOM TOOTH EXTRACTION     Family History: family history includes Cancer in her maternal grandmother; Heart disease in her maternal grandmother.  Social History:  reports that she has never smoked. She has never used smokeless tobacco. She reports that she does not drink alcohol or use drugs. married  HR coordinator  Meds PNV All NKDA     Maternal Diabetes: Yes:  Diabetes Type:  Diet controlled Genetic Screening: Normal Maternal Ultrasounds/Referrals: Normal Fetal Ultrasounds or other Referrals:  None Maternal Substance Abuse:  No Significant Maternal Medications:  None Significant Maternal Lab Results:  Lab values include: Group B Strep negative Other Comments:  Panorama low-risk  Review of Systems  Constitutional: Negative.   HENT: Negative.   Eyes: Negative.   Respiratory: Negative.   Cardiovascular: Negative.   Gastrointestinal: Negative.   Genitourinary: Negative.   Musculoskeletal: Negative.   Skin: Negative.   Neurological: Negative.   Psychiatric/Behavioral: Negative.    Maternal Medical History:  Contractions: Frequency: irregular.    Fetal activity: Perceived fetal activity is normal.    Prenatal complications: hyperthyroidism  Prenatal Complications - Diabetes: gestational. Diabetes is managed by diet.        Blood pressure 124/79, pulse 79, temperature 98.4 F (36.9 C), resp. rate 16, height 5' 3.75" (1.619  m), weight 68.5 kg (151 lb), last menstrual period 12/27/2015. Maternal Exam:  Abdomen: Fundal height is appropriate fpr gestation.   Estimated fetal weight is 7#.   Fetal presentation: vertex  Introitus: Normal vulva. Normal vagina.    Physical Exam  Constitutional: She is oriented to person, place, and time. She appears well-developed and well-nourished.  HENT:  Head: Normocephalic and atraumatic.  Cardiovascular: Normal rate and regular rhythm.   Respiratory: Effort normal and breath sounds normal. No respiratory distress. She has no wheezes.  GI: Soft. Bowel sounds are normal. She exhibits no distension. There is no tenderness.  Musculoskeletal: Normal range of motion.  Neurological: She is alert and oriented to person, place, and time.  Skin: Skin is warm and dry.  Psychiatric: She has a normal mood and affect. Her behavior is normal.    Prenatal labs: ABO, Rh:  O+ Antibody:  neg Rubella:  immune RPR:   NR HBsAg:   neg HIV: Non Reactive (11/22 1927)  GBS:   neg  Hgb 12.2/Plt 255/ Ur Cx + enterococcus, Ur cx WNL/GC neg/Chl neg/Varicella immune/Hgb electro WNL/essential panel neg/AFP WNL/NT WNL/glucola 159/3Hr GDM/bile acid WNL/Panorama low risk, female/  [place] nl anat, post plac, previa, female  Previa resiolved, Good growth by [place]  Assessment/Plan: 31yo G2P0100 at 37+ with VB and h/o IUFD SVE 3cm was 2 cm in office, minimal ctx, now 3.5cm D/w MFM situation of h/o IUFD.  Pt in early labor - will augment PRN  Expect SVD Epidural, nitrous, IV pain meds for pain control   [name] [name] 09/13/2016, 3:21 PM

## 2016-09-13 NOTE — Progress Notes (Signed)
Patient ID: [name] [name], female   DOB: 09-Sep-1985, 31 y.o.   MRN: [id number]   Pt w/o cervical change Decline pitocin AROM for clear fluid, arm presenting,  Unable to reduce  D/w pt r/b/a of LTCS, will proceed

## 2016-09-13 NOTE — Anesthesia Procedure Notes (Signed)
Spinal  Patient location during procedure: OB Start time: 09/13/2016 9:50 PM End time: 09/13/2016 9:55 PM Staffing Anesthesiologist: Rod Mae Performed: anesthesiologist  Preanesthetic Checklist Completed: patient identified, surgical consent, pre-op evaluation, timeout performed, IV checked, risks and benefits discussed and monitors and equipment checked Spinal Block Patient position: sitting Prep: Betadine and site prepped and draped Patient monitoring: heart rate, cardiac monitor, continuous pulse ox and blood pressure Approach: midline Location: L3-4 Injection technique: single-shot Needle Needle type: Pencan  Needle gauge: 24 G Needle length: 10 cm Assessment Sensory level: T4

## 2016-09-13 NOTE — Transfer of Care (Signed)
Immediate Anesthesia Transfer of Care Note  Patient: [name] [name]  Procedure(s) Performed: Procedure(s): CESAREAN SECTION (N/A)  Patient Location: PACU  Anesthesia Type:Spinal  Level of Consciousness: awake, alert  and oriented  Airway & Oxygen Therapy: Patient Spontanous Breathing  Post-op Assessment: Report given to RN and Post -op Vital signs reviewed and stable  Post vital signs: Reviewed and stable  Last Vitals:  Vitals:   09/13/16 1306 09/13/16 1641  BP: 124/79 127/68  Pulse: 79 87  Resp: 16 18  Temp: 36.9 C 36.6 C    Last Pain:  Vitals:   09/13/16 1641  TempSrc: Oral  PainSc: 3          Complications: No apparent anesthesia complications

## 2016-09-13 NOTE — MAU Provider Note (Signed)
NST FHTs 120-130's mod var, + accels, category 1 toco none

## 2016-09-13 NOTE — MAU Note (Signed)
Patient presents with complaint of bright red spotting onset about 1/2 hour ago, positive fetal movement, baby very active more than usual. Denies pain.  Wants to make baby okay, has history of stillbirth.

## 2016-09-13 NOTE — MAU Provider Note (Signed)
History     CSN: [id number]  Arrival date and time: 09/13/16 1252   First Provider Initiated Contact with Patient 09/13/16 1324      Chief Complaint  Patient presents with  . Vaginal Bleeding   HPI [name] [name] 31 y.o. [redacted]w[redacted]d  Comes to MAU as she had one contraction today and then went to the bathroom and noticed bright red blood when she was wiping.  Is very nervous as she had a stillbirth with her last pregnancy.  Came for evaluation.  No pain currently, no contractions, no leaking of fluid, did not wear a pad in.  OB History    Gravida Para Term Preterm AB Living   2 1   1    0   SAB TAB Ectopic Multiple Live Births         0        Past Medical History:  Diagnosis Date  . Asthma   . Graves disease     Past Surgical History:  Procedure Laterality Date  . WISDOM TOOTH EXTRACTION      Family History  Problem Relation Age of Onset  . Cancer Maternal Grandmother   . Heart disease Maternal Grandmother     Social History  Substance Use Topics  . Smoking status: Never Smoker  . Smokeless tobacco: Never Used  . Alcohol use No    Allergies: No Known Allergies  Prescriptions Prior to Admission  Medication Sig Dispense Refill Last Dose  . albuterol (PROVENTIL HFA;VENTOLIN HFA) 108 (90 BASE) MCG/ACT inhaler Inhale 1 puff into the lungs every 6 (six) hours as needed for wheezing or shortness of breath.   Rescue  . Prenatal Vit-Fe Fumarate-FA (PRENATAL MULTIVITAMIN) TABS tablet Take 1 tablet by mouth daily at 12 noon.   07/13/2016 at Unknown time    Review of Systems  Constitutional: Negative for fever.  Gastrointestinal: Negative for abdominal pain.  Genitourinary: Positive for vaginal bleeding. Negative for dysuria, vaginal discharge and vaginal pain.   Physical Exam   Blood pressure 124/79, pulse 79, temperature 98.4 F (36.9 C), resp. rate 16, height 5' 3.75" (1.619 m), weight 151 lb (68.5 kg), last menstrual period 12/27/2015.  Physical Exam  Nursing note  and vitals reviewed. Constitutional: She is oriented to person, place, and time. She appears well-developed and well-nourished.  HENT:  Head: Normocephalic.  Eyes: EOM are normal.  Neck: Neck supple.  GI: Soft. There is no tenderness. There is no rebound and no guarding.  EFM - FHT baseline is 125 with moderate variability -  At first when monitoring began, variability was slightly decreased but after continuing the strip, variability improved and accels of 15x15 easily noted - reactive strip.  Genitourinary:  Genitourinary Comments: Speculum exam: Vulva: very small dark veins seen on labia majora.  Vaginal tissue is edematous and soft, but not red. Vagina - Small amount of cervical mucous , no evidence of any blood. Cervix - No contact bleeding Bimanual exam: Cervix external os open, very posterior - did gentle exam and did not get through the cervix - client was very apprehensive about the exam as it brought back many memories for her. Chaperone present for exam.   Musculoskeletal: Normal range of motion.  Neurological: She is alert and oriented to person, place, and time.  Skin: Skin is warm and dry.  Psychiatric: She has a normal mood and affect.    MAU Course  Procedures  MDM No blood in the vagina.  Will check urine and make  sure that blood client saw was not urinary in origin.  Dr Melba Coon in to see client.  Assessment and Plan  Bleeding in third trimester  Dr. Melba Coon with continue her care as of 1440.  [name] 09/13/2016, 2:02 PM

## 2016-09-14 ENCOUNTER — Encounter (HOSPITAL_COMMUNITY): Payer: Self-pay | Admitting: Obstetrics and Gynecology

## 2016-09-14 LAB — RPR: RPR Ser Ql: NONREACTIVE

## 2016-09-14 LAB — CBC
HEMATOCRIT: 28.5 % — AB (ref 36.0–46.0)
HEMOGLOBIN: 9.8 g/dL — AB (ref 12.0–15.0)
MCH: 34.4 pg — ABNORMAL HIGH (ref 26.0–34.0)
MCHC: 34.4 g/dL (ref 30.0–36.0)
MCV: 100 fL (ref 78.0–100.0)
Platelets: 190 10*3/uL (ref 150–400)
RBC: 2.85 MIL/uL — ABNORMAL LOW (ref 3.87–5.11)
RDW: 14.8 % (ref 11.5–15.5)
WBC: 16.3 10*3/uL — AB (ref 4.0–10.5)

## 2016-09-14 MED ORDER — SCOPOLAMINE 1 MG/3DAYS TD PT72
MEDICATED_PATCH | TRANSDERMAL | Status: AC
  Filled 2016-09-14: qty 1

## 2016-09-14 MED ORDER — OXYTOCIN 10 UNIT/ML IJ SOLN
INTRAMUSCULAR | Status: AC
  Filled 2016-09-14: qty 4

## 2016-09-14 MED ORDER — SIMETHICONE 80 MG PO CHEW
80.0000 mg | CHEWABLE_TABLET | Freq: Three times a day (TID) | ORAL | Status: DC
  Administered 2016-09-14 – 2016-09-15 (×4): 80 mg via ORAL
  Filled 2016-09-14 (×5): qty 1

## 2016-09-14 MED ORDER — SIMETHICONE 80 MG PO CHEW
80.0000 mg | CHEWABLE_TABLET | ORAL | Status: DC | PRN
  Administered 2016-09-14: 80 mg via ORAL

## 2016-09-14 MED ORDER — DIBUCAINE 1 % RE OINT: 1.0000 "application " | TOPICAL_OINTMENT | RECTAL | Status: DC | PRN

## 2016-09-14 MED ORDER — LACTATED RINGERS IV SOLN
INTRAVENOUS | Status: DC
  Administered 2016-09-14 (×2): via INTRAVENOUS

## 2016-09-14 MED ORDER — ZOLPIDEM TARTRATE 5 MG PO TABS: 5.0000 mg | ORAL_TABLET | Freq: Every evening | ORAL | Status: DC | PRN

## 2016-09-14 MED ORDER — MENTHOL 3 MG MT LOZG: 1.0000 | LOZENGE | OROMUCOSAL | Status: DC | PRN

## 2016-09-14 MED ORDER — PRENATAL MULTIVITAMIN CH
1.0000 | ORAL_TABLET | Freq: Every day | ORAL | Status: DC
  Filled 2016-09-14: qty 1

## 2016-09-14 MED ORDER — DEXAMETHASONE SODIUM PHOSPHATE 4 MG/ML IJ SOLN
INTRAMUSCULAR | Status: AC
Start: 2016-09-14 — End: ?
  Filled 2016-09-14: qty 1

## 2016-09-14 MED ORDER — PHENYLEPHRINE 40 MCG/ML (10ML) SYRINGE FOR IV PUSH (FOR BLOOD PRESSURE SUPPORT)
PREFILLED_SYRINGE | INTRAVENOUS | Status: AC
  Filled 2016-09-14: qty 20

## 2016-09-14 MED ORDER — SIMETHICONE 80 MG PO CHEW
80.0000 mg | CHEWABLE_TABLET | ORAL | Status: DC
  Administered 2016-09-16: 80 mg via ORAL
  Filled 2016-09-14 (×2): qty 1

## 2016-09-14 MED ORDER — OXYCODONE HCL 5 MG PO TABS: 5.0000 mg | ORAL_TABLET | ORAL | Status: DC | PRN

## 2016-09-14 MED ORDER — ONDANSETRON HCL 4 MG/2ML IJ SOLN
INTRAMUSCULAR | Status: AC
  Filled 2016-09-14: qty 2

## 2016-09-14 MED ORDER — WITCH HAZEL-GLYCERIN EX PADS: 1.0000 "application " | MEDICATED_PAD | CUTANEOUS | Status: DC | PRN

## 2016-09-14 MED ORDER — ALBUTEROL SULFATE (2.5 MG/3ML) 0.083% IN NEBU: 3.0000 mL | INHALATION_SOLUTION | Freq: Four times a day (QID) | RESPIRATORY_TRACT | Status: DC | PRN

## 2016-09-14 MED ORDER — PHENYLEPHRINE 8 MG IN D5W 100 ML (0.08MG/ML) PREMIX OPTIME
INJECTION | INTRAVENOUS | Status: AC
  Filled 2016-09-14: qty 100

## 2016-09-14 MED ORDER — OXYCODONE HCL 5 MG PO TABS: 10.0000 mg | ORAL_TABLET | ORAL | Status: DC | PRN

## 2016-09-14 MED ORDER — DIPHENHYDRAMINE HCL 25 MG PO CAPS: 25.0000 mg | ORAL_CAPSULE | Freq: Four times a day (QID) | ORAL | Status: DC | PRN

## 2016-09-14 MED ORDER — COCONUT OIL OIL: 1.0000 "application " | TOPICAL_OIL | Status: DC | PRN

## 2016-09-14 MED ORDER — SENNOSIDES-DOCUSATE SODIUM 8.6-50 MG PO TABS
2.0000 | ORAL_TABLET | ORAL | Status: DC
  Administered 2016-09-14 – 2016-09-16 (×2): 2 via ORAL
  Filled 2016-09-14 (×2): qty 2

## 2016-09-14 MED ORDER — IBUPROFEN 800 MG PO TABS
800.0000 mg | ORAL_TABLET | Freq: Three times a day (TID) | ORAL | Status: DC
  Administered 2016-09-14 – 2016-09-16 (×7): 800 mg via ORAL
  Filled 2016-09-14 (×8): qty 1

## 2016-09-14 MED ORDER — ACETAMINOPHEN 325 MG PO TABS
650.0000 mg | ORAL_TABLET | ORAL | Status: DC | PRN
  Administered 2016-09-14 – 2016-09-15 (×4): 650 mg via ORAL
  Filled 2016-09-14 (×4): qty 2

## 2016-09-14 MED ORDER — KETOROLAC TROMETHAMINE 30 MG/ML IJ SOLN
INTRAMUSCULAR | Status: AC
  Administered 2016-09-14: 30 mg
  Filled 2016-09-14: qty 1

## 2016-09-14 MED ORDER — OXYTOCIN 40 UNITS IN LACTATED RINGERS INFUSION - SIMPLE MED: 2.5000 [IU]/h | INTRAVENOUS | Status: AC

## 2016-09-14 NOTE — Op Note (Signed)
NAME:  [name], [name]                    ACCOUNT NO.:  MEDICAL RECORD NO.:  [id number]  LOCATION:                                 FACILITY:  PHYSICIAN:  Thornell Sartorius, MD        DATE OF BIRTH:  16-Oct-1985  DATE OF PROCEDURE:  09/13/2016 DATE OF DISCHARGE:                              OPERATIVE REPORT   PREOPERATIVE DIAGNOSIS:  Intrauterine pregnancy at 37 weeks with malpresentation after rupture of membranes, delivered by primary low transverse cesarean section.  POSTOPERATIVE DIAGNOSIS:  Intrauterine pregnancy at 37 weeks with malpresentation after rupture of membranes, delivered by primary low transverse cesarean section.  PROCEDURE:  Primary low transverse cesarean section.  SURGEON:  Thornell Sartorius, MD  FINDINGS:  Viable female infant at 2215 with Apgars of 9 at 1 minute, 9 at 5 minutes.  Weight pending at the time of dictation.  Normal postpartum uterus, tubes, and ovaries are noted.  ANESTHESIA:  Spinal.  IV FLUIDS:  2300.  URINE OUTPUT:  25 mL clear urine at the end of the procedure.  ESTIMATED BLOOD LOSS:  Approximately 700 mL.  COMPLICATIONS:  None.  PATHOLOGY:  Placenta to Labor and Delivery.  DESCRIPTION OF PROCEDURE:  After informed consent was reviewed with the patient and the FOB including, but not limited to, risks, benefits, and alternatives of the procedure such as bleeding, infection, damage to surrounding organs as well as difficulty with incision healing, injury to infant, she was transported to the OR where spinal was placed.  She was returned to the supine position with a leftward tilt.  Once it was found to be adequate, she was prepped and draped in the normal sterile fashion.  A Pfannenstiel skin incision was made at the level 2 fingerbreadths above the pubic symphysis, carried through the underlying layer of fascia sharply.  The fascia was incised in the midline.  The incision was extended laterally with Mayo scissors.  Superior aspect of the  fascial incision was grasped with Kocher clamps, elevating the rectus muscles, were dissected off both bluntly and sharply.  Midline was easily identified, and the peritoneum was entered bluntly.  The incision was extended superiorly and inferiorly with good visualization of the bladder.  The Alexis skin retractor was placed, carefully making sure that no bowel was entrapped.  The uterus was incised in transverse fashion.  At the uterine incision, it was noted that there were infant's feet as well as the hand.  Infant was attempted to be delivered from breech presentation as a footling breech.  This was unsuccessful.  Feet were replaced in the vagina and the infant was delivered from vertex presentation without complications.  Nose and mouth were suctioned on the field.  The infant's cord was clamped and cut after a minute and handed off to awaiting NICU staff.  Uterine incision was cleared of all clot and debris.  Uterine incision was closed with 2 layers of 0 Monocryl, first of which was running locked and second as an imbricating layer.  The left aspect of the incision, the vesicouterine peritoneum was tacked to the uterine incision as this was bleeding and this was controlled with 3-0  Vicryl.  The gutters were cleared of all clot and debris.  The peritoneum was reapproximated with 2-0 Vicryl in a running locked fashion.  Fascia was closed with 0 Vicryl in a running fashion. This was noted to be hemostatic.  The fascia was closed after subfascial planes were inspected and found to be hemostatic.  The subcuticular adipose layer was made hemostatic with Bovie cautery and the dead space was closed with plain gut.  The incision was closed in subcuticular fashion with 4-0 Vicryl on a Keith needle.  Benzoin and Steri-Strips were applied.  A pressure dressing was then placed.  The patient tolerated the procedure well.  Sponge, lap, and needle counts were correct x2 per the operating  staff.     Thornell Sartorius, MD     JB/MEDQ  D:  09/13/2016  T:  09/14/2016  Job:  [id number]

## 2016-09-14 NOTE — Anesthesia Postprocedure Evaluation (Signed)
Anesthesia Post Note  Patient: [name] [name]  Procedure(s) Performed: Procedure(s) (LRB): CESAREAN SECTION (N/A)     Patient location during evaluation: PACU Anesthesia Type: Spinal Level of consciousness: oriented and awake and alert Pain management: pain level controlled Vital Signs Assessment: post-procedure vital signs reviewed and stable Respiratory status: spontaneous breathing and respiratory function stable Cardiovascular status: blood pressure returned to baseline and stable Postop Assessment: no headache and no backache Anesthetic complications: no    Last Vitals:  Vitals:   09/14/16 0050 09/14/16 0124  BP: 110/63 106/68  Pulse: 64 66  Resp: 17 16  Temp: 37.1 C 37.1 C    Last Pain:  Vitals:   09/14/16 0124  TempSrc: Oral  PainSc: 0-No pain   Pain Goal: Patients Stated Pain Goal: 3 (09/14/16 0050)               Lynda Rainwater

## 2016-09-14 NOTE — Progress Notes (Signed)
Subjective: Postpartum Day 1: Cesarean Delivery Patient reports tolerating PO, + flatus and no problems voiding.  Fatigued but otherwise well. Bonding well with baby - breastfeeding  Objective: Vital signs in last 24 hours: Temp:  [97.6 F (36.4 C)-99.2 F (37.3 C)] 99.2 F (37.3 C) (07/09 0530) Pulse Rate:  [58-88] 68 (07/09 0530) Resp:  [10-24] 18 (07/09 0530) BP: (93-127)/(47-72) 97/47 (07/09 0530) SpO2:  [96 %-100 %] 99 % (07/09 0530) Weight:  [151 lb (68.5 kg)] 151 lb (68.5 kg) (07/08 1641)  Physical Exam:  General: alert, cooperative and no distress Lochia: appropriate Uterine Fundus: firm Incision: dressing c/d/i DVT Evaluation: No evidence of DVT seen on physical exam.   Recent Labs  09/13/16 1700 09/14/16 0544  HGB 12.9 9.8*  HCT 38.4 28.5*    Assessment/Plan: Status post Cesarean section. Doing well postoperatively.  Continue current care.  [name] [name] 09/14/2016, 1:58 PM

## 2016-09-14 NOTE — Progress Notes (Signed)
Patient ID: [name] [name], female   DOB: [birth year], 31 y.o.   MRN: [id number] Pt in bathroom. No immediate needs. Will return to assess

## 2016-09-14 NOTE — Anesthesia Postprocedure Evaluation (Signed)
Anesthesia Post Note  Patient: [name] [name]  Procedure(s) Performed: Procedure(s) (LRB): CESAREAN SECTION (N/A)     Patient location during evaluation: Mother Baby Anesthesia Type: Spinal Level of consciousness: awake Pain management: pain level controlled Vital Signs Assessment: post-procedure vital signs reviewed and stable Respiratory status: spontaneous breathing Cardiovascular status: stable Postop Assessment: no headache, no backache, spinal receding and patient able to bend at knees Anesthetic complications: no    Last Vitals:  Vitals:   09/14/16 0430 09/14/16 0530  BP: (!) 96/48 (!) 97/47  Pulse: (!) 58 68  Resp: 16 18  Temp:  37.3 C    Last Pain:  Vitals:   09/14/16 0530  TempSrc: Oral  PainSc: 0-No pain   Pain Goal: Patients Stated Pain Goal: 3 (09/14/16 0050)               Everette Rank

## 2016-09-14 NOTE — Lactation Note (Signed)
This note was copied from a baby's chart. Lactation Consultation Note  Patient Name: [name] [name] Today's Date: 09/14/2016 Reason for consult: Initial assessment;Difficult latch  Baby 57 hors old when [facility] consult started.  LC changed a mec smear diaper to start, placed baby STS and left T-shirt on due to Decreased temp/ hat. Mom has areola edema and reported the baby's latches have been on and off and if latched has only  Stay for short interval. LC assessed breast tissue and noted the areolas to be edematous.  Nipple short shaft, and erect. Baby has a high palate, and unable to obtain depth.  See doc flow sheets for feeding assessments and several attempts.  Hand expressed 2 ml total from both breast and spoon  fed / baby tolerated well.  After spoon feeding, baby latched on the #24 NS and opened wide and stayed the longest, scant amount  Of colostrum in the nipple shield.  LC instructed mom on the use hand pump , shells, application of the NS.  Due to areola edema - LC discussed with mom the latching is going to take time.  [place] Plan discussed - breast shells between feedings except when sleeping or STS.  Breast massage, hand express, pre-pump with hand pump ( #24 F good fit ) , latch with #24 NS,  Feed , spoon feed, and post pump after feeding.   LC planned to set up the DEBP at consult and mom and dad requested time to sleep.  And to be set up after nap.  MBU RN aware of the [facility] plan and need for the DEBP to be set up ( lit in room)      Maternal Data Has patient been taught Hand Expression?: Yes Does the patient have breastfeeding experience prior to this delivery?: No  Feeding Feeding Type: Breast Fed Length of feed: 5 min  LATCH Score/Interventions Latch: Grasps breast easily, tongue down, lips flanged, rhythmical sucking. Intervention(s): Skin to skin;Teach feeding cues;Waking techniques Intervention(s): Adjust position;Assist with latch;Breast massage;Breast  compression  Audible Swallowing: A few with stimulation  Type of Nipple: Everted at rest and after stimulation  Comfort (Breast/Nipple): Soft / non-tender     Hold (Positioning): Assistance needed to correctly position infant at breast and maintain latch. Intervention(s): Breastfeeding basics reviewed;Position options;Skin to skin;Support Pillows  LATCH Score: 8  Lactation Tools Discussed/Used Tools: Nipple [place] Fuel;Shells;Pump Nipple shield size: 24;Other (comment) (#24 NS a better fit ) Shell Type: Inverted Breast pump type: Double-Electric Breast Pump (mom requested to take a nap and then be set up for post  pumping ) Pump Review: Setup, frequency, and cleaning Initiated by:: MAI  Date initiated:: 09/14/16   Consult Status Consult Status: Follow-up Date: 09/15/16 Follow-up type: In-patient    Bliss 09/14/2016, 4:42 PM

## 2016-09-14 NOTE — Addendum Note (Signed)
Addendum  created 09/14/16 0735 by Georgeanne Nim, CRNA   Sign clinical note

## 2016-09-15 MED ORDER — CITRANATAL RX 27-1 MG PO TABS
1.0000 | ORAL_TABLET | Freq: Every day | ORAL | Status: DC
  Administered 2016-09-15 – 2016-09-16 (×2): 1 via ORAL
  Filled 2016-09-15: qty 1

## 2016-09-15 NOTE — Progress Notes (Signed)
Subjective: Postpartum Day 2: Cesarean Delivery Patient reports incisional pain, tolerating PO and no problems voiding.  Ambulating fine and pain controlled with po meds  Would like to stay until tomorrow  Objective: Vital signs in last 24 hours: Temp:  [97.4 F (36.3 C)-98.7 F (37.1 C)] 97.4 F (36.3 C) (07/10 0500) Pulse Rate:  [52-60] 57 (07/10 0500) Resp:  [18] 18 (07/10 0500) BP: (82-95)/(44-53) 95/53 (07/10 0500)  Physical Exam:  General: alert and cooperative Lochia: appropriate Uterine Fundus: firm Incision: C/D/I    Recent Labs  09/13/16 1700 09/14/16 0544  HGB 12.9 9.8*  HCT 38.4 28.5*    Assessment/Plan: Status post Cesarean section. Doing well postoperatively.  Continue current care. D/w pt incision care and circumcision care  Logan Bores 09/15/2016, 9:58 AM

## 2016-09-15 NOTE — Lactation Note (Signed)
This note was copied from a baby's chart. Lactation Consultation Note  Patient Name: Boy Vayla Wilford Today's Date: 09/15/2016 Reason for consult: Follow-up assessment  Baby 77 hours old. Mom reports that she was able to collect 10 ml of EBM using the DEBP. Mom states that baby only wanted to take 5 ml at last feeding. Parents report that baby has been very sleepy since circumcision. Offered to assist with a latch, but mom declined stating that baby seems to be latching well. Discussed the need to keep pumping as long as using NS, and discussed methods of moving away from the use of NS. Enc mom to put baby to breast with cues, then supplement with EBM. Enc increasing supplementation amounts to 10 - 20 ml through tonight. Discussed putting EBM in NS with curve-tipped syringe for supplementation as well. Enc mom to post-pump followed by hand expression. Mom states that she is feeling her breast start to become heavier. Discussed progression of milk coming to volume. Enc mom to call for assistance as needed.   Maternal Data    Feeding Feeding Type: Breast Milk  LATCH Score/Interventions                      Lactation Tools Discussed/Used Tools: Pump;Nipple Shields Breast pump type: Double-Electric Breast Pump Pump Review: Setup, frequency, and cleaning;Milk Storage Initiated by:: Bedside RN Date initiated:: 09/14/16   Consult Status Consult Status: Follow-up Date: 09/16/16 Follow-up type: In-patient    Andres Labrum 09/15/2016, 2:14 PM

## 2016-09-16 ENCOUNTER — Encounter (HOSPITAL_COMMUNITY): Payer: Self-pay | Admitting: *Deleted

## 2016-09-16 MED ORDER — IBUPROFEN 800 MG PO TABS: 800.0000 mg | ORAL_TABLET | Freq: Three times a day (TID) | ORAL | 1 refills | Status: DC | PRN

## 2016-09-16 MED ORDER — OXYCODONE HCL 5 MG PO TABS: ORAL_TABLET | ORAL | 0 refills | Status: DC

## 2016-09-16 MED ORDER — PRENATAL MULTIVITAMIN CH: 1.0000 | ORAL_TABLET | Freq: Every day | ORAL | 3 refills | Status: DC

## 2016-09-16 MED ORDER — IRON 325 (65 FE) MG PO TABS: 1.0000 | ORAL_TABLET | Freq: Every day | ORAL | 1 refills | Status: DC

## 2016-09-16 NOTE — Lactation Note (Signed)
This note was copied from a baby's chart. Lactation Consultation Note  Patient Name: [name] [name] Today's Date: 09/16/2016 Reason for consult: Follow-up assessment  Baby 89 hours old. Mom reports baby is nursing well, she is hearing swallows at the breast and her milk is starting to come to volume. Offered to assist with a latch, but mom declined. Scheduled an outpatient appointment for Friday, 09/18/16 at 0830 to work on latching without NS. Discussed the need to pump 4-6 times a day in order to protect milk supply and mom reports that she has a Medela DEBP at home. Enc supplementing baby with EBM to make sure baby satisfied. Discussed engorgement prevention/treatment, and mom aware to call for assistance as needed.   Maternal Data    Feeding Feeding Type: Breast Fed Length of feed: 15 min  LATCH Score/Interventions                      Lactation Tools Discussed/Used Tools: Pump;Nipple Shields Nipple shield size: 24;20 Breast pump type: Double-Electric Breast Pump   Consult Status Consult Status: Complete    Andres Labrum 09/16/2016, 9:46 AM

## 2016-09-16 NOTE — Progress Notes (Addendum)
Subjective: Postpartum Day 3: Cesarean Delivery Patient reports incisional pain, tolerating PO and no problems voiding.  Pain controlled, nl lochia.  Emotionally doing well.    Objective: Vital signs in last 24 hours: Temp:  [97.7 F (36.5 C)-98.3 F (36.8 C)] 97.7 F (36.5 C) (07/11 0543) Pulse Rate:  [60-68] 68 (07/11 0543) Resp:  [16-18] 16 (07/11 0543) BP: (104-105)/(55-63) 105/55 (07/11 0543)  Physical Exam:  General: alert and no distress Lochia: appropriate Uterine Fundus: firm Incision: healing well DVT Evaluation: No evidence of DVT seen on physical exam.   Recent Labs  09/13/16 1700 09/14/16 0544  HGB 12.9 9.8*  HCT 38.4 28.5*    Assessment/Plan: Status post Cesarean section. Doing well postoperatively.  Discharge home with standard precautions and return to clinic in 2 weeks.  D/C with Motrin, PNV and Percocet.  Routine PP instructions/care  [name] [name] 09/16/2016, 7:42 AM

## 2016-09-16 NOTE — Discharge Summary (Signed)
OB Discharge Summary     Patient Name: [name] [name] DOB: [birth year] MRN: [id number]  Date of admission: 09/13/2016 Delivering MD: Janyth Contes   Date of discharge: 09/16/2016  Admitting diagnosis: 37.2 WKS, SPOTTING Intrauterine pregnancy: [redacted]w[redacted]d     Secondary diagnosis:  Principal Problem:   S/P cesarean section Active Problems:   History of IUFD  Additional problems: GDM     Discharge diagnosis: Term Pregnancy Delivered                                                                                                Post partum procedures:none  Augmentation: AROM  Complications: None  Hospital course:  Onset of Labor With Unplanned C/S  31 y.o. yo G2P0100 at [redacted]w[redacted]d was admitted in Latent Labor on 09/13/2016. Patient had a labor course significant for malpresentation with ROM. Membrane Rupture Time/Date: 9:17 PM ,09/13/2016   The patient went for cesarean section due to Malpresentation, and delivered a Viable infant,09/13/2016  Details of operation can be found in separate operative note. Patient had an uncomplicated postpartum course.  She is ambulating,tolerating a regular diet, passing flatus, and urinating well.  Patient is discharged home in stable condition 09/16/16.  Physical exam  Vitals:   09/14/16 2337 09/15/16 0500 09/15/16 1859 09/16/16 0543  BP: (!) 87/44 (!) 95/53 104/63 (!) 105/55  Pulse: 60 (!) 57 60 68  Resp: 18 18 18 16   Temp: 97.8 F (36.6 C) (!) 97.4 F (36.3 C) 98.3 F (36.8 C) 97.7 F (36.5 C)  TempSrc: Oral Axillary Oral Oral  SpO2:      Weight:      Height:       General: alert and no distress Lochia: appropriate Uterine Fundus: firm Incision: Healing well with no significant drainage DVT Evaluation: No evidence of DVT seen on physical exam. Labs: Lab Results  Component Value Date   WBC 16.3 (H) 09/14/2016   HGB 9.8 (L) 09/14/2016   HCT 28.5 (L) 09/14/2016   MCV 100.0 09/14/2016   PLT 190 09/14/2016   CMP Latest Ref Rng & Units  10/15/2014  Glucose 65 - 99 mg/dL 126(H)  BUN 6 - 20 mg/dL 13  Creatinine 0.44 - 1.00 mg/dL 0.53  Sodium 135 - 145 mmol/L 137  Potassium 3.5 - 5.1 mmol/L 4.0  Chloride 101 - 111 mmol/L 102  CO2 22 - 32 mmol/L 26  Calcium 8.9 - 10.3 mg/dL 9.8  Total Protein 6.5 - 8.1 g/dL -  Total Bilirubin 0.3 - 1.2 mg/dL -  Alkaline Phos 38 - 126 U/L -  AST 15 - 41 U/L -  ALT 14 - 54 U/L -    Discharge instruction: per After Visit Summary and "Baby and Me Booklet".  After visit meds:  Allergies as of 09/16/2016   No Known Allergies     Medication List    STOP taking these medications   aspirin 81 MG chewable tablet     TAKE these medications   albuterol 108 (90 Base) MCG/ACT inhaler Commonly known as:  PROVENTIL HFA;VENTOLIN HFA Inhale 1 puff into the lungs every 6 (  six) hours as needed for wheezing or shortness of breath.   ibuprofen 800 MG tablet Commonly known as:  ADVIL,MOTRIN Take 1 tablet (800 mg total) by mouth every 8 (eight) hours as needed.   Iron 325 (65 Fe) MG Tabs Take 1 tablet by mouth at bedtime. What changed:  medication strength   oxyCODONE 5 MG immediate release tablet Commonly known as:  Oxy IR/ROXICODONE 1-2 po q 6 hours prn severe pain   prenatal multivitamin Tabs tablet Take 1 tablet by mouth daily at 12 noon.       Diet: routine diet  Activity: Advance as tolerated. Pelvic rest for 6 weeks.   Outpatient follow up:2 and 6 weeks Follow up Appt:No future appointments. Follow up Visit:No Follow-up on file.  Postpartum contraception: Undecided  Newborn Data: Live born female  Birth Weight: 6 lb 11.9 oz (3060 g) APGAR: 9, 9  Baby Feeding: Breast Disposition:home with mother   09/16/2016 Janyth Contes, MD

## 2016-10-05 DIAGNOSIS — E0500 Thyrotoxicosis with diffuse goiter without thyrotoxic crisis or storm: Secondary | ICD-10-CM | POA: Diagnosis not present

## 2016-10-19 DIAGNOSIS — Z124 Encounter for screening for malignant neoplasm of cervix: Secondary | ICD-10-CM | POA: Diagnosis not present

## 2016-11-12 DIAGNOSIS — L818 Other specified disorders of pigmentation: Secondary | ICD-10-CM | POA: Diagnosis not present

## 2016-11-12 DIAGNOSIS — E0500 Thyrotoxicosis with diffuse goiter without thyrotoxic crisis or storm: Secondary | ICD-10-CM | POA: Diagnosis not present

## 2016-11-12 DIAGNOSIS — N63 Unspecified lump in unspecified breast: Secondary | ICD-10-CM | POA: Diagnosis not present

## 2016-11-12 DIAGNOSIS — B078 Other viral warts: Secondary | ICD-10-CM | POA: Diagnosis not present

## 2016-12-03 DIAGNOSIS — B078 Other viral warts: Secondary | ICD-10-CM | POA: Diagnosis not present

## 2016-12-03 DIAGNOSIS — D225 Melanocytic nevi of trunk: Secondary | ICD-10-CM | POA: Diagnosis not present

## 2016-12-03 DIAGNOSIS — D485 Neoplasm of uncertain behavior of skin: Secondary | ICD-10-CM | POA: Diagnosis not present

## 2016-12-03 DIAGNOSIS — D2272 Melanocytic nevi of left lower limb, including hip: Secondary | ICD-10-CM | POA: Diagnosis not present

## 2016-12-31 IMAGING — DX DG CHEST 2V
2 series · 2 of 2 positions shown · non-contrast
Comparison: None.

CLINICAL DATA: 29-year-old female with chest pain and sensation of
heart racing for the past 2 weeks

EXAM:
CHEST  2 VIEW

[chest pa]
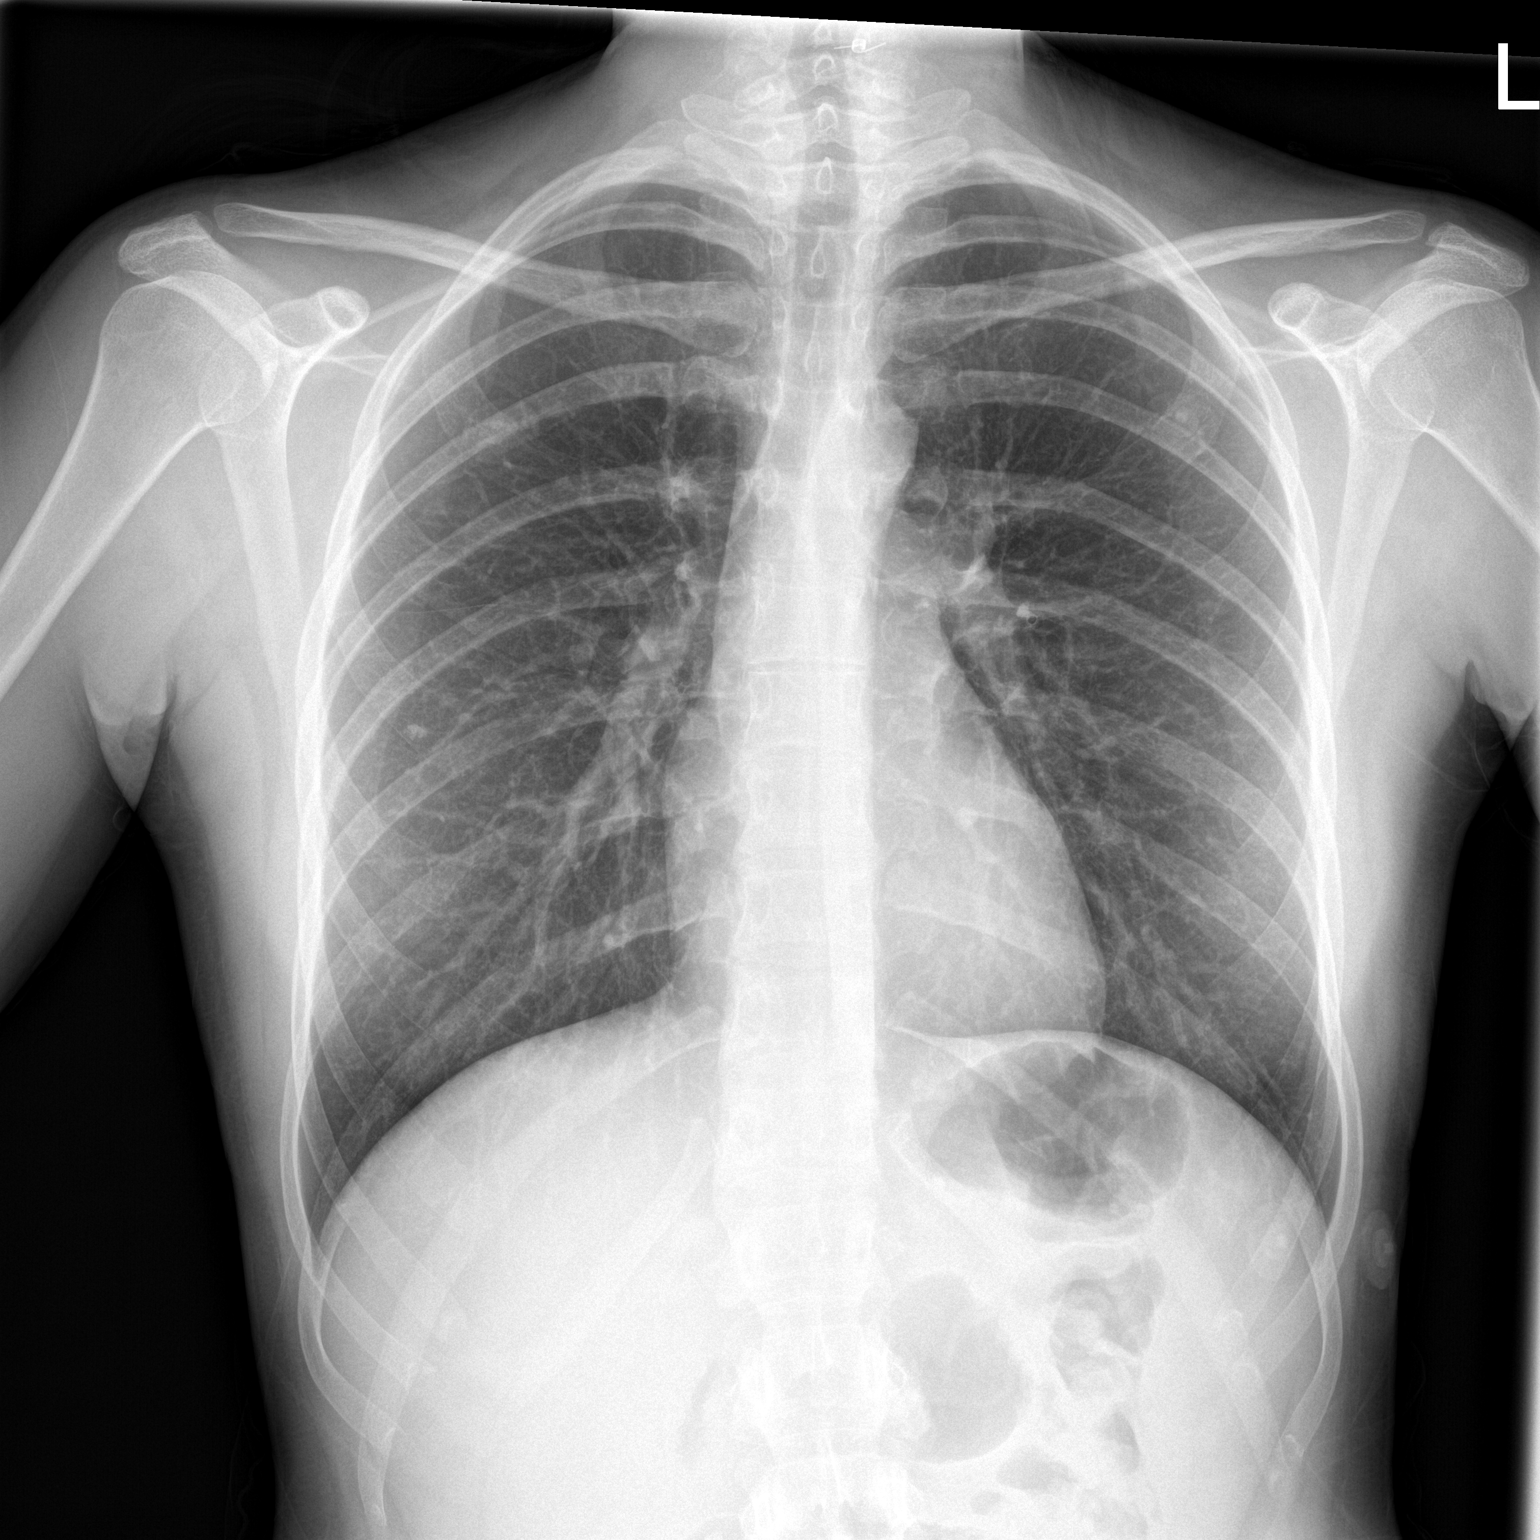

[chest lat]
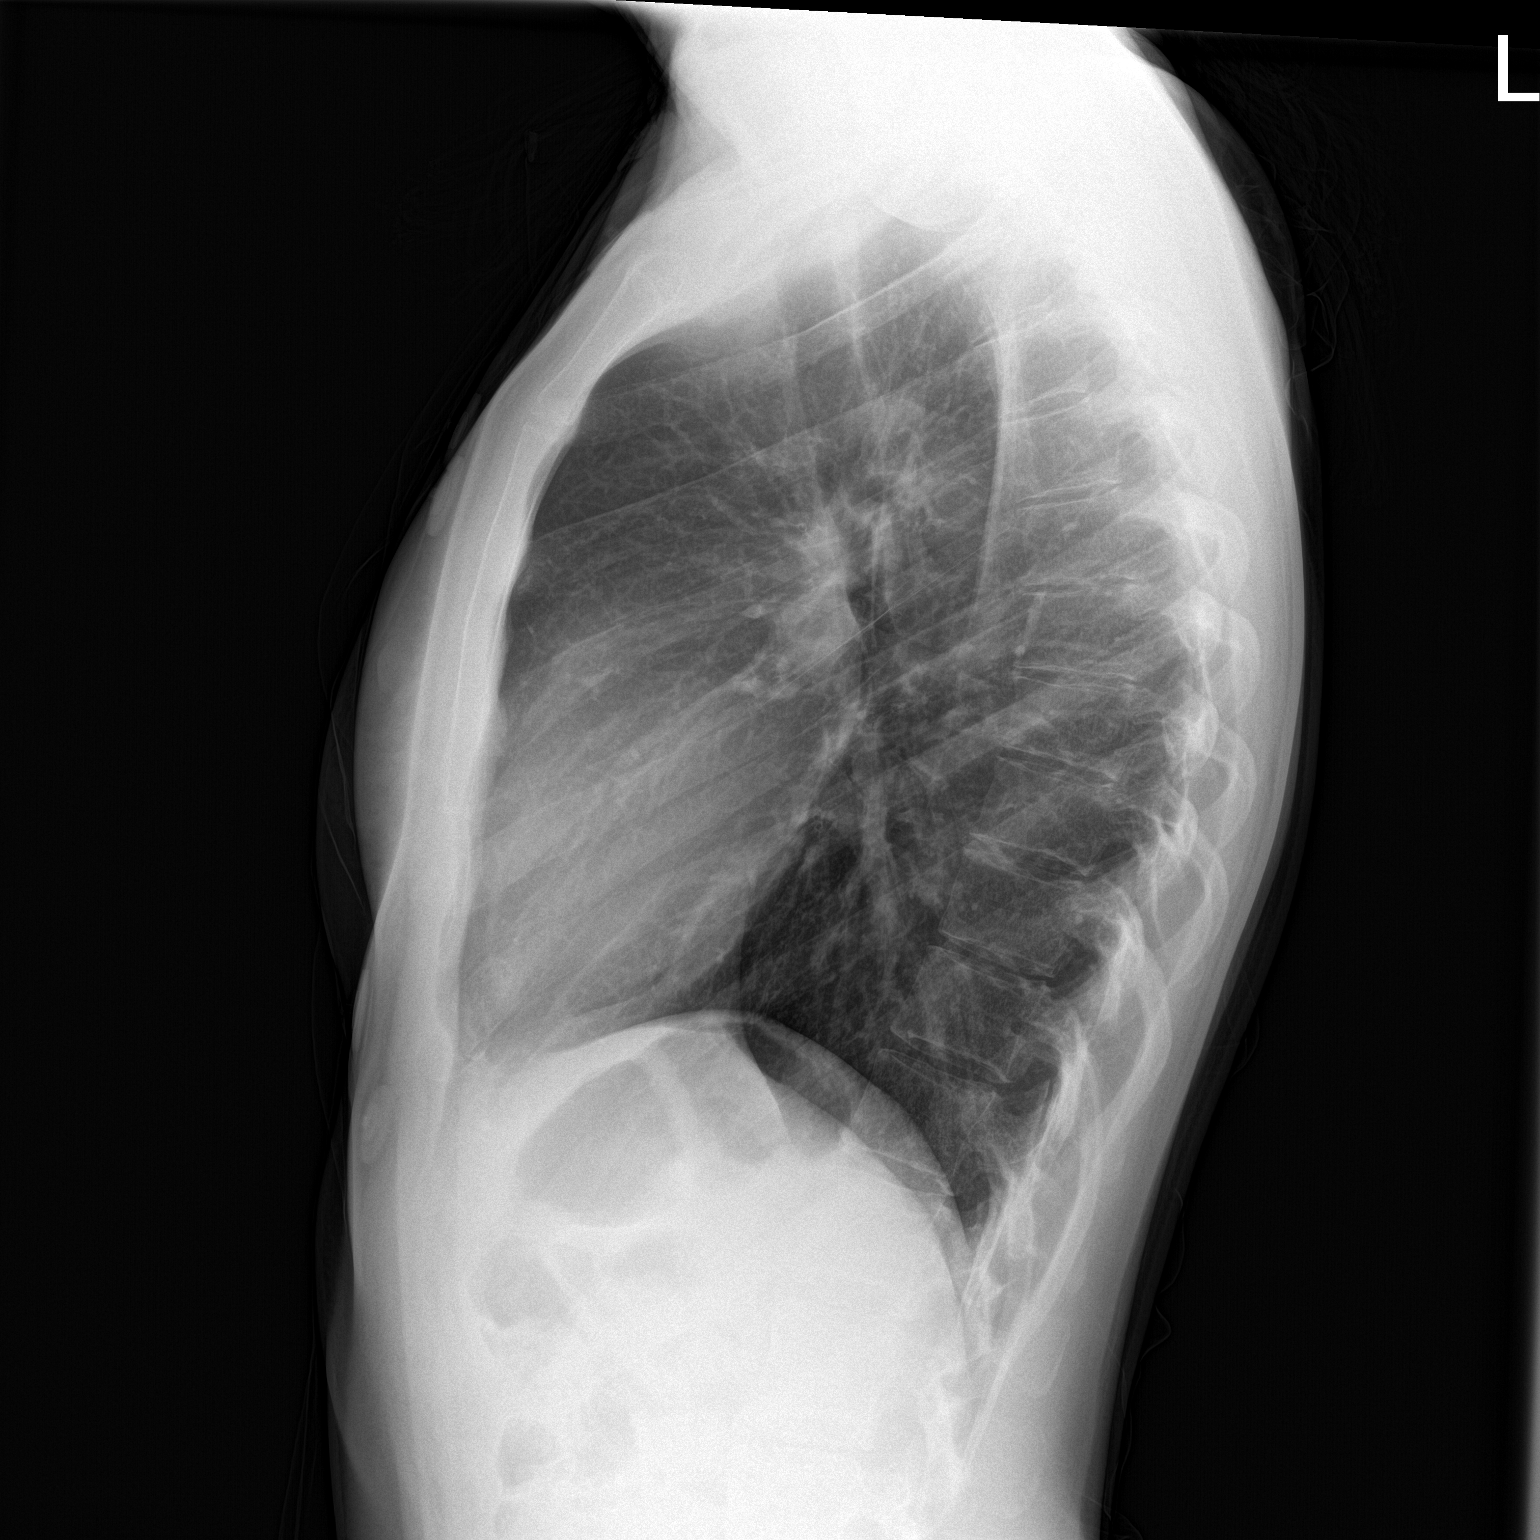

[2 of 2 positions shown; findings below may reference images not displayed]

FINDINGS: The lungs are clear and negative for focal airspace consolidation,
pulmonary edema or suspicious pulmonary nodule. No pleural effusion
or pneumothorax. Cardiac and mediastinal contours are within normal
limits. No acute fracture or lytic or blastic osseous lesions. The
visualized upper abdominal bowel gas pattern is unremarkable.
IMPRESSION: Normal chest x-ray.

## 2017-01-08 DIAGNOSIS — E0500 Thyrotoxicosis with diffuse goiter without thyrotoxic crisis or storm: Secondary | ICD-10-CM | POA: Diagnosis not present

## 2017-01-13 DIAGNOSIS — D485 Neoplasm of uncertain behavior of skin: Secondary | ICD-10-CM | POA: Diagnosis not present

## 2017-01-13 DIAGNOSIS — L988 Other specified disorders of the skin and subcutaneous tissue: Secondary | ICD-10-CM | POA: Diagnosis not present

## 2017-01-20 DIAGNOSIS — R350 Frequency of micturition: Secondary | ICD-10-CM | POA: Diagnosis not present

## 2017-02-09 DIAGNOSIS — B078 Other viral warts: Secondary | ICD-10-CM | POA: Diagnosis not present

## 2017-02-19 DIAGNOSIS — E0500 Thyrotoxicosis with diffuse goiter without thyrotoxic crisis or storm: Secondary | ICD-10-CM | POA: Diagnosis not present

## 2017-04-20 DIAGNOSIS — J069 Acute upper respiratory infection, unspecified: Secondary | ICD-10-CM | POA: Diagnosis not present

## 2017-04-20 DIAGNOSIS — Z20828 Contact with and (suspected) exposure to other viral communicable diseases: Secondary | ICD-10-CM | POA: Diagnosis not present

## 2017-04-20 DIAGNOSIS — Z8709 Personal history of other diseases of the respiratory system: Secondary | ICD-10-CM | POA: Diagnosis not present

## 2017-05-11 ENCOUNTER — Other Ambulatory Visit: Payer: Self-pay | Admitting: Obstetrics and Gynecology

## 2017-05-11 DIAGNOSIS — N6310 Unspecified lump in the right breast, unspecified quadrant: Secondary | ICD-10-CM

## 2017-05-14 ENCOUNTER — Ambulatory Visit
Admission: RE | Admit: 2017-05-14 | Discharge: 2017-05-14 | Disposition: A | Discharge status: Home / Self Care | Payer: 59 | Source: Ambulatory Visit | Attending: Obstetrics and Gynecology | Admitting: Obstetrics and Gynecology

## 2017-05-14 DIAGNOSIS — N6310 Unspecified lump in the right breast, unspecified quadrant: Secondary | ICD-10-CM | POA: Diagnosis not present

## 2017-05-14 DIAGNOSIS — R922 Inconclusive mammogram: Secondary | ICD-10-CM | POA: Diagnosis not present

## 2017-06-11 DIAGNOSIS — E0500 Thyrotoxicosis with diffuse goiter without thyrotoxic crisis or storm: Secondary | ICD-10-CM | POA: Diagnosis not present

## 2017-08-11 DIAGNOSIS — N6322 Unspecified lump in the left breast, upper inner quadrant: Secondary | ICD-10-CM | POA: Diagnosis not present

## 2017-08-23 DIAGNOSIS — M545 Low back pain: Secondary | ICD-10-CM | POA: Diagnosis not present

## 2017-08-24 ENCOUNTER — Encounter (HOSPITAL_COMMUNITY): Payer: Self-pay | Admitting: Emergency Medicine

## 2017-08-24 ENCOUNTER — Encounter (HOSPITAL_COMMUNITY)
Admission: EM | Disposition: A | Discharge status: Home / Self Care | Payer: Self-pay | Source: Home / Self Care | Attending: Emergency Medicine

## 2017-08-24 ENCOUNTER — Observation Stay (HOSPITAL_COMMUNITY)
Admission: EM | Admit: 2017-08-24 | Discharge: 2017-08-25 | Disposition: A | Discharge status: Home / Self Care | Payer: 59 | Attending: Surgery | Admitting: Surgery

## 2017-08-24 ENCOUNTER — Observation Stay (HOSPITAL_COMMUNITY): Payer: 59 | Admitting: Anesthesiology

## 2017-08-24 ENCOUNTER — Other Ambulatory Visit: Payer: Self-pay

## 2017-08-24 ENCOUNTER — Emergency Department (HOSPITAL_COMMUNITY): Payer: 59

## 2017-08-24 DIAGNOSIS — E0500 Thyrotoxicosis with diffuse goiter without thyrotoxic crisis or storm: Secondary | ICD-10-CM | POA: Insufficient documentation

## 2017-08-24 DIAGNOSIS — R1030 Lower abdominal pain, unspecified: Secondary | ICD-10-CM | POA: Diagnosis not present

## 2017-08-24 DIAGNOSIS — Z79899 Other long term (current) drug therapy: Secondary | ICD-10-CM | POA: Diagnosis not present

## 2017-08-24 DIAGNOSIS — Z8249 Family history of ischemic heart disease and other diseases of the circulatory system: Secondary | ICD-10-CM | POA: Diagnosis not present

## 2017-08-24 DIAGNOSIS — J45909 Unspecified asthma, uncomplicated: Secondary | ICD-10-CM | POA: Insufficient documentation

## 2017-08-24 DIAGNOSIS — K3580 Unspecified acute appendicitis: Secondary | ICD-10-CM | POA: Diagnosis not present

## 2017-08-24 DIAGNOSIS — R109 Unspecified abdominal pain: Secondary | ICD-10-CM | POA: Diagnosis not present

## 2017-08-24 HISTORY — DX: Gestational diabetes mellitus in pregnancy, unspecified control: O24.419

## 2017-08-24 HISTORY — DX: Arthropathic psoriasis, unspecified: L40.50

## 2017-08-24 HISTORY — PX: APPENDECTOMY: SHX54

## 2017-08-24 HISTORY — PX: LAPAROSCOPIC APPENDECTOMY: SHX408

## 2017-08-24 HISTORY — DX: Major depressive disorder, single episode, unspecified: F32.9

## 2017-08-24 HISTORY — DX: Postpartum depression: F53.0

## 2017-08-24 HISTORY — DX: Essential (primary) hypertension: I10

## 2017-08-24 HISTORY — DX: Other mental disorders complicating the puerperium: O99.345

## 2017-08-24 HISTORY — DX: Anxiety disorder, unspecified: F41.9

## 2017-08-24 LAB — COMPREHENSIVE METABOLIC PANEL
ALBUMIN: 3.8 g/dL (ref 3.5–5.0)
ALT: 14 U/L (ref 14–54)
AST: 21 U/L (ref 15–41)
Alkaline Phosphatase: 81 U/L (ref 38–126)
Anion gap: 6 (ref 5–15)
BUN: 7 mg/dL (ref 6–20)
CHLORIDE: 106 mmol/L (ref 101–111)
CO2: 27 mmol/L (ref 22–32)
Calcium: 9.1 mg/dL (ref 8.9–10.3)
Creatinine, Ser: 0.89 mg/dL (ref 0.44–1.00)
GFR calc Af Amer: >60 mL/min (ref >60)
GFR calc non Af Amer: >60 mL/min (ref >60)
GLUCOSE: 91 mg/dL (ref 65–99)
POTASSIUM: 4.2 mmol/L (ref 3.5–5.1)
Sodium: 139 mmol/L (ref 135–145)
Total Bilirubin: 1 mg/dL (ref 0.3–1.2)
Total Protein: 7.1 g/dL (ref 6.5–8.1)

## 2017-08-24 LAB — URINALYSIS, ROUTINE W REFLEX MICROSCOPIC
BILIRUBIN URINE: NEGATIVE
GLUCOSE, UA: NEGATIVE mg/dL
Hgb urine dipstick: NEGATIVE
KETONES UR: NEGATIVE mg/dL
Leukocytes, UA: NEGATIVE
Nitrite: NEGATIVE
PH: 7 (ref 5.0–8.0)
Protein, ur: NEGATIVE mg/dL
Specific Gravity, Urine: 1.009 (ref 1.005–1.030)

## 2017-08-24 LAB — CBC
HEMATOCRIT: 37.7 % (ref 36.0–46.0)
Hemoglobin: 12.3 g/dL (ref 12.0–15.0)
MCH: 31.2 pg (ref 26.0–34.0)
MCHC: 32.6 g/dL (ref 30.0–36.0)
MCV: 95.7 fL (ref 78.0–100.0)
Platelets: 242 10*3/uL (ref 150–400)
RBC: 3.94 MIL/uL (ref 3.87–5.11)
RDW: 13.1 % (ref 11.5–15.5)
WBC: 9.8 10*3/uL (ref 4.0–10.5)

## 2017-08-24 LAB — LIPASE, BLOOD: Lipase: 29 U/L (ref 11–51)

## 2017-08-24 LAB — I-STAT BETA HCG BLOOD, ED (MC, WL, AP ONLY): I-stat hCG, quantitative: <5 m[IU]/mL (ref <5)

## 2017-08-24 SURGERY — APPENDECTOMY, LAPAROSCOPIC
Anesthesia: General

## 2017-08-24 MED ORDER — SODIUM CHLORIDE 0.9 % IR SOLN
Status: DC | PRN
  Administered 2017-08-24: 1000 mL

## 2017-08-24 MED ORDER — MORPHINE SULFATE (PF) 4 MG/ML IV SOLN: 4.0000 mg | Freq: Once | INTRAVENOUS | Status: DC

## 2017-08-24 MED ORDER — SODIUM CHLORIDE 0.9 % IV SOLN
Freq: Once | INTRAVENOUS | Status: AC
Start: 2017-08-24 — End: 2017-08-24
  Administered 2017-08-24: 15:00:00 via INTRAVENOUS

## 2017-08-24 MED ORDER — SUGAMMADEX SODIUM 200 MG/2ML IV SOLN
INTRAVENOUS | Status: DC | PRN
  Administered 2017-08-24: 140 mg via INTRAVENOUS

## 2017-08-24 MED ORDER — MIDAZOLAM HCL 2 MG/2ML IJ SOLN
INTRAMUSCULAR | Status: AC
  Filled 2017-08-24: qty 2

## 2017-08-24 MED ORDER — METRONIDAZOLE IN NACL 5-0.79 MG/ML-% IV SOLN
500.0000 mg | Freq: Once | INTRAVENOUS | Status: AC
  Administered 2017-08-24: 500 mg via INTRAVENOUS
  Filled 2017-08-24: qty 100

## 2017-08-24 MED ORDER — LIDOCAINE 2% (20 MG/ML) 5 ML SYRINGE
INTRAMUSCULAR | Status: DC | PRN
  Administered 2017-08-24: 60 mg via INTRAVENOUS

## 2017-08-24 MED ORDER — LACTATED RINGERS IV SOLN
INTRAVENOUS | Status: DC
  Administered 2017-08-24: 15:00:00 via INTRAVENOUS

## 2017-08-24 MED ORDER — ONDANSETRON HCL 4 MG/2ML IJ SOLN
INTRAMUSCULAR | Status: DC | PRN
  Administered 2017-08-24: 4 mg via INTRAVENOUS

## 2017-08-24 MED ORDER — ONDANSETRON HCL 4 MG/2ML IJ SOLN: 4.0000 mg | Freq: Four times a day (QID) | INTRAMUSCULAR | Status: DC | PRN

## 2017-08-24 MED ORDER — MIDAZOLAM HCL 5 MG/5ML IJ SOLN
INTRAMUSCULAR | Status: DC | PRN
  Administered 2017-08-24: 2 mg via INTRAVENOUS

## 2017-08-24 MED ORDER — IOHEXOL 300 MG/ML  SOLN: 100.0000 mL | Freq: Once | INTRAMUSCULAR | Status: DC | PRN

## 2017-08-24 MED ORDER — TRAMADOL HCL 50 MG PO TABS
50.0000 mg | ORAL_TABLET | Freq: Four times a day (QID) | ORAL | Status: DC | PRN
  Administered 2017-08-24: 50 mg via ORAL
  Filled 2017-08-24: qty 1

## 2017-08-24 MED ORDER — FENTANYL CITRATE (PF) 100 MCG/2ML IJ SOLN: 50.0000 ug | INTRAMUSCULAR | Status: DC | PRN

## 2017-08-24 MED ORDER — BUPIVACAINE-EPINEPHRINE (PF) 0.5% -1:200000 IJ SOLN
INTRAMUSCULAR | Status: AC
  Filled 2017-08-24: qty 30

## 2017-08-24 MED ORDER — ONDANSETRON HCL 4 MG/2ML IJ SOLN: INTRAMUSCULAR | Status: DC | PRN

## 2017-08-24 MED ORDER — FENTANYL CITRATE (PF) 250 MCG/5ML IJ SOLN
INTRAMUSCULAR | Status: AC
  Filled 2017-08-24: qty 5

## 2017-08-24 MED ORDER — KCL IN DEXTROSE-NACL 20-5-0.9 MEQ/L-%-% IV SOLN
INTRAVENOUS | Status: DC
  Filled 2017-08-24 (×2): qty 1000

## 2017-08-24 MED ORDER — METHOCARBAMOL 500 MG PO TABS: 500.0000 mg | ORAL_TABLET | Freq: Four times a day (QID) | ORAL | Status: DC | PRN

## 2017-08-24 MED ORDER — ENOXAPARIN SODIUM 40 MG/0.4ML ~~LOC~~ SOLN: 40.0000 mg | SUBCUTANEOUS | Status: DC

## 2017-08-24 MED ORDER — SODIUM CHLORIDE 0.9 % IV SOLN
2.0000 g | INTRAVENOUS | Status: DC
  Filled 2017-08-24: qty 20

## 2017-08-24 MED ORDER — DEXAMETHASONE SODIUM PHOSPHATE 10 MG/ML IJ SOLN
INTRAMUSCULAR | Status: AC
  Filled 2017-08-24: qty 1

## 2017-08-24 MED ORDER — ZOLPIDEM TARTRATE 5 MG PO TABS: 5.0000 mg | ORAL_TABLET | Freq: Every evening | ORAL | Status: DC | PRN

## 2017-08-24 MED ORDER — CEFTRIAXONE SODIUM 2 G IJ SOLR
2.0000 g | Freq: Once | INTRAMUSCULAR | Status: AC
  Administered 2017-08-24: 2 g via INTRAVENOUS
  Filled 2017-08-24: qty 20

## 2017-08-24 MED ORDER — POTASSIUM CHLORIDE 2 MEQ/ML IV SOLN
INTRAVENOUS | Status: DC
  Filled 2017-08-24 (×3): qty 1000

## 2017-08-24 MED ORDER — FENTANYL CITRATE (PF) 100 MCG/2ML IJ SOLN
INTRAMUSCULAR | Status: DC | PRN
  Administered 2017-08-24 (×3): 50 ug via INTRAVENOUS

## 2017-08-24 MED ORDER — ACETAMINOPHEN 500 MG PO TABS
1000.0000 mg | ORAL_TABLET | Freq: Four times a day (QID) | ORAL | Status: DC
  Administered 2017-08-24 – 2017-08-25 (×3): 1000 mg via ORAL
  Filled 2017-08-24 (×3): qty 2

## 2017-08-24 MED ORDER — ROCURONIUM BROMIDE 10 MG/ML (PF) SYRINGE
PREFILLED_SYRINGE | INTRAVENOUS | Status: DC | PRN
  Administered 2017-08-24: 25 mg via INTRAVENOUS

## 2017-08-24 MED ORDER — HYDROMORPHONE HCL 2 MG/ML IJ SOLN: 0.2500 mg | INTRAMUSCULAR | Status: DC | PRN

## 2017-08-24 MED ORDER — PROPOFOL 10 MG/ML IV BOLUS
INTRAVENOUS | Status: DC | PRN
  Administered 2017-08-24: 100 mg via INTRAVENOUS

## 2017-08-24 MED ORDER — BUPIVACAINE-EPINEPHRINE 0.5% -1:200000 IJ SOLN
INTRAMUSCULAR | Status: DC | PRN
  Administered 2017-08-24: 3 mL

## 2017-08-24 MED ORDER — METRONIDAZOLE IN NACL 5-0.79 MG/ML-% IV SOLN
500.0000 mg | Freq: Three times a day (TID) | INTRAVENOUS | Status: DC
  Administered 2017-08-24 – 2017-08-25 (×2): 500 mg via INTRAVENOUS
  Filled 2017-08-24 (×2): qty 100

## 2017-08-24 MED ORDER — IOHEXOL 300 MG/ML  SOLN
100.0000 mL | Freq: Once | INTRAMUSCULAR | Status: AC
  Administered 2017-08-24: 100 mL via INTRAVENOUS

## 2017-08-24 MED ORDER — ONDANSETRON 4 MG PO TBDP: 4.0000 mg | ORAL_TABLET | Freq: Four times a day (QID) | ORAL | Status: DC | PRN

## 2017-08-24 MED ORDER — 0.9 % SODIUM CHLORIDE (POUR BTL) OPTIME
TOPICAL | Status: DC | PRN
  Administered 2017-08-24: 1000 mL

## 2017-08-24 MED ORDER — SUCCINYLCHOLINE CHLORIDE 20 MG/ML IJ SOLN
INTRAMUSCULAR | Status: DC | PRN
  Administered 2017-08-24: 80 mg via INTRAVENOUS

## 2017-08-24 MED ORDER — HYDRALAZINE HCL 20 MG/ML IJ SOLN: 10.0000 mg | INTRAMUSCULAR | Status: DC | PRN

## 2017-08-24 MED ORDER — PROPOFOL 10 MG/ML IV BOLUS
INTRAVENOUS | Status: AC
  Filled 2017-08-24: qty 20

## 2017-08-24 MED ORDER — DEXAMETHASONE SODIUM PHOSPHATE 10 MG/ML IJ SOLN
INTRAMUSCULAR | Status: DC | PRN
  Administered 2017-08-24: 10 mg via INTRAVENOUS

## 2017-08-24 MED ORDER — MORPHINE SULFATE (PF) 4 MG/ML IV SOLN: 1.0000 mg | INTRAVENOUS | Status: DC | PRN

## 2017-08-24 SURGICAL SUPPLY — 47 items
APPLIER CLIP ROT 10 11.4 M/L (STAPLE)
BLADE CLIPPER SURG (BLADE) IMPLANT
CANISTER SUCT 3000ML PPV (MISCELLANEOUS) ×3 IMPLANT
CHLORAPREP W/TINT 26ML (MISCELLANEOUS) ×3 IMPLANT
CLIP APPLIE ROT 10 11.4 M/L (STAPLE) IMPLANT
COVER SURGICAL LIGHT HANDLE (MISCELLANEOUS) ×3 IMPLANT
CUTTER FLEX LINEAR 45M (STAPLE) ×6 IMPLANT
DERMABOND ADVANCED (GAUZE/BANDAGES/DRESSINGS) ×2
DERMABOND ADVANCED .7 DNX12 (GAUZE/BANDAGES/DRESSINGS) ×1 IMPLANT
DRAPE WARM FLUID 44X44 (DRAPE) ×3 IMPLANT
ELECT REM PT RETURN 9FT ADLT (ELECTROSURGICAL) ×3
ELECTRODE REM PT RTRN 9FT ADLT (ELECTROSURGICAL) ×1 IMPLANT
ENDOLOOP SUT PDS II  0 18 (SUTURE)
ENDOLOOP SUT PDS II 0 18 (SUTURE) IMPLANT
GLOVE BIO SURGEON STRL SZ 6.5 (GLOVE) ×2 IMPLANT
GLOVE BIO SURGEON STRL SZ7.5 (GLOVE) ×3 IMPLANT
GLOVE BIO SURGEON STRL SZ8 (GLOVE) ×3 IMPLANT
GLOVE BIO SURGEONS STRL SZ 6.5 (GLOVE) ×1
GLOVE BIOGEL PI IND STRL 6.5 (GLOVE) ×1 IMPLANT
GLOVE BIOGEL PI IND STRL 7.5 (GLOVE) ×1 IMPLANT
GLOVE BIOGEL PI IND STRL 8 (GLOVE) ×1 IMPLANT
GLOVE BIOGEL PI INDICATOR 6.5 (GLOVE) ×2
GLOVE BIOGEL PI INDICATOR 7.5 (GLOVE) ×2
GLOVE BIOGEL PI INDICATOR 8 (GLOVE) ×2
GOWN STRL REUS W/ TWL LRG LVL3 (GOWN DISPOSABLE) ×2 IMPLANT
GOWN STRL REUS W/ TWL XL LVL3 (GOWN DISPOSABLE) ×1 IMPLANT
GOWN STRL REUS W/TWL LRG LVL3 (GOWN DISPOSABLE) ×4
GOWN STRL REUS W/TWL XL LVL3 (GOWN DISPOSABLE) ×2
KIT BASIN OR (CUSTOM PROCEDURE TRAY) ×3 IMPLANT
KIT TURNOVER KIT B (KITS) ×3 IMPLANT
NS IRRIG 1000ML POUR BTL (IV SOLUTION) ×3 IMPLANT
PAD ARMBOARD 7.5X6 YLW CONV (MISCELLANEOUS) ×6 IMPLANT
POUCH SPECIMEN RETRIEVAL 10MM (ENDOMECHANICALS) ×3 IMPLANT
RELOAD STAPLE TA45 3.5 REG BLU (ENDOMECHANICALS) ×3 IMPLANT
SCISSORS LAP 5X35 DISP (ENDOMECHANICALS) ×3 IMPLANT
SET IRRIG TUBING LAPAROSCOPIC (IRRIGATION / IRRIGATOR) ×3 IMPLANT
SHEARS HARMONIC ACE PLUS 36CM (ENDOMECHANICALS) ×3 IMPLANT
SPECIMEN JAR SMALL (MISCELLANEOUS) ×3 IMPLANT
SUT MON AB 4-0 PC3 18 (SUTURE) ×6 IMPLANT
TOWEL OR 17X24 6PK STRL BLUE (TOWEL DISPOSABLE) ×3 IMPLANT
TOWEL OR 17X26 10 PK STRL BLUE (TOWEL DISPOSABLE) ×3 IMPLANT
TRAY FOLEY CATH SILVER 16FR (SET/KITS/TRAYS/PACK) ×3 IMPLANT
TRAY LAPAROSCOPIC MC (CUSTOM PROCEDURE TRAY) ×3 IMPLANT
TROCAR XCEL BLADELESS 5X75MML (TROCAR) ×6 IMPLANT
TROCAR XCEL BLUNT TIP 100MML (ENDOMECHANICALS) ×3 IMPLANT
TUBING INSUFFLATION (TUBING) ×3 IMPLANT
WATER STERILE IRR 1000ML POUR (IV SOLUTION) ×3 IMPLANT

## 2017-08-24 NOTE — ED Triage Notes (Signed)
Pt states she has been having abd pain for two days. Pt having cramping, bloating, diarrhea, "warm sensation in lower abd", nausea. Pain with palpation to lower abd.

## 2017-08-24 NOTE — ED Provider Notes (Signed)
[facility] EMERGENCY DEPARTMENT Provider Note   CSN: [id number] Arrival date & time: 08/24/17  1044     History   Chief Complaint Chief Complaint  Patient presents with  . Abdominal Pain    HPI [name] [name] is a 32 y.o. female with a history of cesarean section presenting for lower abdominal pain and bloating for proximately 2 days.  States that the pain is a constant dull pain made worse with movement, patient states that the pain began diffusely in her lower abdomen but has now concentrated in the lower right quadrant.  Patient describes her pain at this time as 3/10 however pain can increase to 8/10 when moving. Patient states that she has felt like she cannot pass gas or stool since yesterday.  Patient states that it is painful and she is unable to pass gas today.  Patient states that she has nauseous but has not vomited since the pain began.  Patient admits to a loose stool on Sunday and states that her last bowel movement was last night however it was only a small amount of stool passed. History of fever, increasing vaginal discharge, hematemesis, dysuria, hematuria, blood in the stool. Patient went and saw her OB/GYN yesterday and they performed a urinalysis.  Patient states that her urinalysis was negative for bacteria. Patient states that she has an infant at home and she is still breast-feeding at this time.  C-section approximately 11 months ago. HPI  Past Medical History:  Diagnosis Date  . Asthma   . Graves disease   . S/P cesarean section 10-07-2016    Patient Active Problem List   Diagnosis Date Noted  . Acute appendicitis 08/24/2017  . History of IUFD 2016-10-07  . S/P cesarean section 10-07-16  . Intrauterine fetal death 2014/09/13  . Fetal demise 08/19/2014  . [redacted] weeks gestation of pregnancy     Past Surgical History:  Procedure Laterality Date  . CESAREAN SECTION N/A 10-07-2016   Procedure: CESAREAN SECTION;  Surgeon: Janyth Contes, MD;  Location: [facility];  Service: Obstetrics;  Laterality: N/A;  . WISDOM TOOTH EXTRACTION       OB History    Gravida  2   Para  1   Term      Preterm  1   AB      Living  0     SAB      TAB      Ectopic      Multiple  0   Live Births               Home Medications    Prior to Admission medications   Medication Sig Start Date End Date Taking? Authorizing Provider  albuterol (PROVENTIL HFA;VENTOLIN HFA) 108 (90 BASE) MCG/ACT inhaler Inhale 1 puff into the lungs every 6 (six) hours as needed for wheezing or shortness of breath.   Yes [provider]  Prenatal Vit-Fe Fumarate-FA (PRENATAL MULTIVITAMIN) TABS tablet Take 1 tablet by mouth daily at 12 noon. 09/16/16  Yes Bovard-Stuckert, Jody, MD  Ferrous Sulfate (IRON) 325 (65 Fe) MG TABS Take 1 tablet by mouth at bedtime. Patient not taking: Reported on 08/24/2017 09/16/16   Bovard-Stuckert, Jeral Fruit, MD  ibuprofen (ADVIL,MOTRIN) 800 MG tablet Take 1 tablet (800 mg total) by mouth every 8 (eight) hours as needed. Patient not taking: Reported on 08/24/2017 09/16/16   Bovard-Stuckert, Jeral Fruit, MD  oxyCODONE (OXY IR/ROXICODONE) 5 MG immediate release tablet 1-2 po q 6 hours  prn severe pain Patient not taking: Reported on 08/24/2017 09/16/16   Bovard-Stuckert, Jeral Fruit, MD  sulfamethoxazole-trimethoprim (BACTRIM DS,SEPTRA DS) 800-160 MG tablet Take 1 tablet by mouth every 12 (twelve) hours. x5 days. 07/29/17   [provider]    Family History Family History  Problem Relation Age of Onset  . Cancer Maternal Grandmother   . Heart disease Maternal Grandmother     Social History Social History   Tobacco Use  . Smoking status: Never Smoker  . Smokeless tobacco: Never Used  Substance Use Topics  . Alcohol use: No  . Drug use: No     Allergies   Patient has no known allergies.   Review of Systems Review of Systems  Constitutional: Negative.  Negative for chills, fatigue and fever.  HENT:  Negative.  Negative for congestion, ear pain, rhinorrhea, sore throat and trouble swallowing.   Eyes: Negative.  Negative for pain and visual disturbance.  Respiratory: Negative.  Negative for cough, chest tightness and shortness of breath.   Cardiovascular: Negative.  Negative for chest pain and leg swelling.  Gastrointestinal: Positive for abdominal distention, abdominal pain, constipation, diarrhea and nausea. Negative for blood in stool and vomiting.  Genitourinary: Negative.  Negative for difficulty urinating, dysuria, flank pain, hematuria, pelvic pain, vaginal bleeding and vaginal discharge.  Musculoskeletal: Negative.  Negative for arthralgias, myalgias and neck pain.  Skin: Negative.  Negative for rash.  Neurological: Negative.  Negative for dizziness, syncope, weakness, light-headedness and headaches.     Physical Exam Updated Vital Signs BP 111/70 (BP Location: Right Arm)   Pulse 80   Temp 98.4 F (36.9 C) (Oral)   Resp 16   LMP 07/27/2017   SpO2 100%   Physical Exam  Constitutional: She is oriented to person, place, and time. She appears well-developed and well-nourished.  Non-toxic appearance. She does not appear ill. No distress.  HENT:  Head: Normocephalic and atraumatic.  Neck: Normal range of motion. Neck supple.  Cardiovascular: Normal rate, regular rhythm, normal heart sounds and intact distal pulses. Exam reveals no gallop and no friction rub.  No murmur heard. Pulmonary/Chest: Effort normal and breath sounds normal. No respiratory distress. She has no wheezes.  Abdominal: Soft. Normal appearance. Bowel sounds are decreased. There is tenderness. There is tenderness at McBurney's point. There is no rigidity, no rebound, no CVA tenderness and negative Murphy's sign.    Neurological: She is alert and oriented to person, place, and time.  Skin: Skin is warm and dry. She is not diaphoretic.  Psychiatric: She has a normal mood and affect. Her behavior is normal.      ED Treatments / Results  Labs (all labs ordered are listed, but only abnormal results are displayed) Labs Reviewed  LIPASE, BLOOD  COMPREHENSIVE METABOLIC PANEL  CBC  URINALYSIS, ROUTINE W REFLEX MICROSCOPIC  I-STAT BETA HCG BLOOD, ED ([facility], [facility], [facility] ONLY)    EKG None  Radiology Ct Abdomen Pelvis W Contrast  Result Date: 08/24/2017 CLINICAL DATA:  Abdominal pain for 2 days. EXAM: CT ABDOMEN AND PELVIS WITH CONTRAST TECHNIQUE: Multidetector CT imaging of the abdomen and pelvis was performed using the standard protocol following bolus administration of intravenous contrast. CONTRAST:  140mL OMNIPAQUE IOHEXOL 300 MG/ML  SOLN COMPARISON:  None. FINDINGS: Lower chest: No acute abnormality. Hepatobiliary: No focal liver abnormality is seen. No gallstones, gallbladder wall thickening, or biliary dilatation. Pancreas: Unremarkable. No pancreatic ductal dilatation or surrounding inflammatory changes. Spleen: Normal in size without focal abnormality. Adrenals/Urinary Tract: Adrenal glands are  unremarkable. Kidneys are normal, without renal calculi, focal lesion, or hydronephrosis. Bladder is unremarkable. Stomach/Bowel: Stomach is within normal limits. Dilated appendix measuring 12 mm with mucosal enhancement and mild surrounding inflammatory changes most concerning for acute appendicitis. Small amount of pelvic free fluid. No evidence of bowel wall thickening, distention, or inflammatory changes. Vascular/Lymphatic: No significant vascular findings are present. No enlarged abdominal or pelvic lymph nodes. Reproductive: Prostate is unremarkable. Other: No abdominal wall hernia or abnormality. Musculoskeletal: No acute or significant osseous findings. IMPRESSION: 1. Findings concerning for mild early appendicitis. Small amount of pelvic free fluid. Electronically Signed   By: Kathreen Devoid   On: 08/24/2017 14:17    Procedures Procedures (including critical care time)  Medications Ordered in  ED Medications  iohexol (OMNIPAQUE) 300 MG/ML solution 100 mL (has no administration in time range)  cefTRIAXone (ROCEPHIN) 2 g in sodium chloride 0.9 % 100 mL IVPB (2 g Intravenous New Bag/Given 08/24/17 1445)    And  metroNIDAZOLE (FLAGYL) IVPB 500 mg (has no administration in time range)  morphine 4 MG/ML injection 1-2 mg (has no administration in time range)  dextrose 5 % and 0.45% NaCl 1,000 mL with potassium chloride 20 mEq infusion (has no administration in time range)  morphine 4 MG/ML injection 4 mg (has no administration in time range)  lactated ringers infusion (has no administration in time range)  iohexol (OMNIPAQUE) 300 MG/ML solution 100 mL (100 mLs Intravenous Contrast Given 08/24/17 1352)  0.9 %  sodium chloride infusion ( Intravenous New Bag/Given 08/24/17 1444)     Initial Impression / Assessment and Plan / ED Course  I have reviewed the triage vital signs and the nursing notes.  Pertinent labs & imaging results that were available during my care of the patient were reviewed by me and considered in my medical decision making (see chart for details).  Clinical Course as of Aug 24 1521  Tue Aug 24, 2017  1515 Surgery seeing patient in ed, patient now requesting pain medication.  Pain medication ordered.   [BM]    Clinical Course User Index [BM] Deliah Boston, PA-C    CT abdomen pelvis findings show concern for a mild early appendicitis with a small amount of free pelvic fluid.  Patient's last oral intake approximately 4 hours ago, 10:30 AM this morning.  IV antibiotics ordered, general surgery consult called.  Patient informed of findings and of plan.  Patient states understanding of plan and is agreeable.  Surgery consult call taken by Lenn Sink PA-C.  Surgery coming down to evaluate patient to admit.  Final Clinical Impressions(s) / ED Diagnoses   Final diagnoses:  Acute appendicitis without peritonitis    ED Discharge Orders    None       Gari Crown 08/24/17 1523    Quintella Reichert, MD 08/25/17 [phone]

## 2017-08-24 NOTE — Anesthesia Procedure Notes (Signed)
Procedure Name: Intubation Date/Time: 08/24/2017 4:29 PM Performed by: Jenne Campus, CRNA Pre-anesthesia Checklist: Patient identified, Emergency Drugs available, Suction available and Patient being monitored Patient Re-evaluated:Patient Re-evaluated prior to induction Oxygen Delivery Method: Circle System Utilized Preoxygenation: Pre-oxygenation with 100% oxygen Induction Type: IV induction, Rapid sequence and Cricoid Pressure applied Laryngoscope Size: Miller and 2 Grade View: Grade I Tube type: Oral Tube size: 7.0 mm Number of attempts: 1 Airway Equipment and Method: Stylet and Oral airway Placement Confirmation: ETT inserted through vocal cords under direct vision,  positive ETCO2 and breath sounds checked- equal and bilateral Secured at: 22 cm Tube secured with: Tape Dental Injury: Teeth and Oropharynx as per pre-operative assessment

## 2017-08-24 NOTE — ED Notes (Signed)
Patient back from CT.

## 2017-08-24 NOTE — Progress Notes (Signed)
Report received from the ED reported that  pt ate a full meal at 1030 today Dr Kalman Shan called and informed - will have to wait 6 hours until surgery. Christian Mate, Rn informed (OR Desk)

## 2017-08-24 NOTE — ED Notes (Signed)
Patient states she ate egg white muffin and hasbrown this morning at 10:30 with a few sips of soda.

## 2017-08-24 NOTE — Transfer of Care (Signed)
Immediate Anesthesia Transfer of Care Note  Patient: [name] [name]  Procedure(s) Performed: APPENDECTOMY LAPAROSCOPIC (N/A )  Patient Location: PACU  Anesthesia Type:General  Level of Consciousness: awake, alert  and oriented  Airway & Oxygen Therapy: Patient Spontanous Breathing  Post-op Assessment: Report given to RN and Post -op Vital signs reviewed and stable  Post vital signs: Reviewed and stable  Last Vitals:  Vitals Value Taken Time  BP 104/70 08/24/2017  5:40 PM  Temp 36.5 C 08/24/2017  5:40 PM  Pulse 102 08/24/2017  5:43 PM  Resp 30 08/24/2017  5:43 PM  SpO2 100 % 08/24/2017  5:43 PM  Vitals shown include unvalidated device data.  Last Pain:  Vitals:   08/24/17 1516  TempSrc:   PainSc: 4          Complications: No apparent anesthesia complications

## 2017-08-24 NOTE — Anesthesia Preprocedure Evaluation (Addendum)
Anesthesia Evaluation  Patient identified by MRN, date of birth, ID band Patient awake    Reviewed: Allergy & Precautions, H&P , NPO status , Patient's Chart, lab work & pertinent test results  Airway Mallampati: II  TM Distance: >3 FB Neck ROM: Full    Dental no notable dental hx. (+) Teeth Intact, Dental Advisory Given   Pulmonary asthma ,    Pulmonary exam normal breath sounds clear to auscultation       Cardiovascular negative cardio ROS   Rhythm:Regular Rate:Normal     Neuro/Psych negative neurological ROS  negative psych ROS   GI/Hepatic negative GI ROS, Neg liver ROS,   Endo/Other  negative endocrine ROS  Renal/GU negative Renal ROS  negative genitourinary   Musculoskeletal   Abdominal   Peds  Hematology negative hematology ROS (+)   Anesthesia Other Findings   Reproductive/Obstetrics negative OB ROS                            Anesthesia Physical Anesthesia Plan  ASA: II  Anesthesia Plan: General   Post-op Pain Management:    Induction: Intravenous, Rapid sequence and Cricoid pressure planned  PONV Risk Score and Plan: 4 or greater and Ondansetron, Dexamethasone and Midazolam  Airway Management Planned: Oral ETT  Additional Equipment:   Intra-op Plan:   Post-operative Plan: Extubation in OR  Informed Consent: I have reviewed the patients History and Physical, chart, labs and discussed the procedure including the risks, benefits and alternatives for the proposed anesthesia with the patient or authorized representative who has indicated his/her understanding and acceptance.     Dental advisory given  Plan Discussed with: CRNA  Anesthesia Plan Comments:        Anesthesia Quick Evaluation  

## 2017-08-24 NOTE — ED Notes (Signed)
Patient denies questions regarding surgery - informed consent signed and at bedside.

## 2017-08-24 NOTE — ED Notes (Signed)
Consulting provider at bedside

## 2017-08-24 NOTE — ED Notes (Signed)
Patient states she had breakfast from [employer] at 10:30am today. Reported off to Short Stay RN.

## 2017-08-24 NOTE — Op Note (Signed)
Appendectomy, Lap, Procedure Note  Indications: The patient presented with a history of right-sided abdominal pain. A CT ,  PHYSICAL EXAM and history  revealed findings consistent with acute appendicitis. The procedure has been discussed with the patient.  Alternative therapies have been discussed with the patient.  Operative risks include bleeding,  Infection,  Organ injury,  Nerve injury,  Blood vessel injury,  DVT,  Pulmonary embolism,  Death,  And possible reoperation.  Medical management risks include worsening of present situation.  The success of the procedure is 50 -90 % at treating patients symptoms.  The patient understands and agrees to proceed.  Pre-operative Diagnosis: Acute appendicitis without mention of peritonitis  Post-operative Diagnosis: Same  Surgeon: Joyice Faster Johncharles Fusselman   Assistants: none  Anesthesia: General endotracheal anesthesia and Local anesthesia 0.25.% bupivacaine, with epinephrine  ASA Class: 2  Procedure Details  The patient was seen again in the Holding Room. The risks, benefits, complications, treatment options, and expected outcomes were discussed with the patient and/or family. The possibilities of reaction to medication, pulmonary aspiration, perforation of viscus, bleeding, recurrent infection, finding a normal appendix, the need for additional procedures, failure to diagnose a condition, and creating a complication requiring transfusion or operation were discussed. There was concurrence with the proposed plan and informed consent was obtained. The site of surgery was properly noted/marked. The patient was taken to Operating Room, identified as [name] [name] and the procedure verified as Appendectomy. A Time Out was held and the above information confirmed.  The patient was placed in the supine position and general anesthesia was induced, along with placement of orogastric tube, Venodyne boots, and a Foley catheter. The abdomen was prepped and draped in a  sterile fashion. A one centimeter infraumbilical incision was made and the peritoneal cavity was accessed using the OPEN  technique. The pneumoperitoneum was then established to steady pressure of 12 mmHg. A 12 mm port was placed through the umbilical incision. Additional 5 mm cannulas then placed in the left lower midline  of the abdomen and right upper  Quadrant  under direct vision. A careful evaluation of the entire abdomen was carried out. The patient was placed in Trendelenburg and left lateral decubitus position. The small intestines were retracted in the cephalad and left lateral direction away from the pelvis and right lower quadrant. The patient was found to have an enlarged and inflamed appendix that was extending into the pelvis. There was no evidence of perforation.  The appendix was carefully dissected. A window was made in the mesoappendix at the base of the appendix. A harmonic scalpel was used across the mesoappendix. The appendix was divided at its base using an endo-GIA stapler. Minimal appendiceal stump was left in place. There was no evidence of bleeding, leakage, or complication after division of the appendix. Irrigation was also performed and irrigate suctioned from the abdomen as well.  The umbilical port site was closed using 0 vicryl pursestring sutures fashion at the level of the fascia. The trocar site skin wounds were closed using skin staples.  Instrument, sponge, and needle counts were correct at the conclusion of the case.   Findings: The appendix was found to be inflamed. There were not signs of necrosis.  There was not perforation. There was not abscess formation.  Estimated Blood Loss:  less than 50 mL         Drains: none         Total IV Fluids: per record  Specimens: appendix         Complications:  None; patient tolerated the procedure well.         Disposition: PACU - hemodynamically stable.         Condition: stable

## 2017-08-24 NOTE — H&P (Signed)
[name] [name] is an 32 y.o. female.   Chief Complaint: RLQ abdominal pain 2 days  HPI: Asked to see by Mosie Lukes MD for 2 day hx of RLQ abdominal pain diarrhea. Pain located RLQ abdomen severe and made worse with movement. Associated diarrhea.  No blood in stool.  No radiation of pain and no dysuria CT shows acute appendicitis   Past Medical History:  Diagnosis Date  . Asthma   . Graves disease   . S/P cesarean section 09/13/2016    Past Surgical History:  Procedure Laterality Date  . CESAREAN SECTION N/A 09/13/2016   Procedure: CESAREAN SECTION;  Surgeon: Janyth Contes, MD;  Location: [facility];  Service: Obstetrics;  Laterality: N/A;  . WISDOM TOOTH EXTRACTION      Family History  Problem Relation Age of Onset  . Cancer Maternal Grandmother   . Heart disease Maternal Grandmother    Social History:  reports that she has never smoked. She has never used smokeless tobacco. She reports that she does not drink alcohol or use drugs.  Allergies: No Known Allergies  Medications Prior to Admission  Medication Sig Dispense Refill  . albuterol (PROVENTIL HFA;VENTOLIN HFA) 108 (90 BASE) MCG/ACT inhaler Inhale 1 puff into the lungs every 6 (six) hours as needed for wheezing or shortness of breath.    . Prenatal Vit-Fe Fumarate-FA (PRENATAL MULTIVITAMIN) TABS tablet Take 1 tablet by mouth daily at 12 noon. 100 tablet 3  . Ferrous Sulfate (IRON) 325 (65 Fe) MG TABS Take 1 tablet by mouth at bedtime. (Patient not taking: Reported on 08/24/2017) 90 each 1  . ibuprofen (ADVIL,MOTRIN) 800 MG tablet Take 1 tablet (800 mg total) by mouth every 8 (eight) hours as needed. (Patient not taking: Reported on 08/24/2017) 45 tablet 1  . oxyCODONE (OXY IR/ROXICODONE) 5 MG immediate release tablet 1-2 po q 6 hours prn severe pain (Patient not taking: Reported on 08/24/2017) 30 tablet 0  . sulfamethoxazole-trimethoprim (BACTRIM DS,SEPTRA DS) 800-160 MG tablet Take 1 tablet by mouth every 12 (twelve)  hours. x5 days.  0    Results for orders placed or performed during the hospital encounter of 08/24/17 (from the past 48 hour(s))  Urinalysis, Routine w reflex microscopic     Status: None   Collection Time: 08/24/17 10:58 AM  Result Value Ref Range   Color, Urine YELLOW YELLOW   APPearance CLEAR CLEAR   Specific Gravity, Urine 1.009 1.005 - 1.030   pH 7.0 5.0 - 8.0   Glucose, UA NEGATIVE NEGATIVE mg/dL   Hgb urine dipstick NEGATIVE NEGATIVE   Bilirubin Urine NEGATIVE NEGATIVE   Ketones, ur NEGATIVE NEGATIVE mg/dL   Protein, ur NEGATIVE NEGATIVE mg/dL   Nitrite NEGATIVE NEGATIVE   Leukocytes, UA NEGATIVE NEGATIVE    Comment: Performed at [facility] [address] [place], [place] [address]  Lipase, blood     Status: None   Collection Time: 08/24/17 11:13 AM  Result Value Ref Range   Lipase 29 11 - 51 U/L    Comment: Performed at [facility] [address] [place], [place] [address]  Comprehensive metabolic panel     Status: None   Collection Time: 08/24/17 11:13 AM  Result Value Ref Range   Sodium 139 135 - 145 mmol/L   Potassium 4.2 3.5 - 5.1 mmol/L   Chloride 106 101 - 111 mmol/L   CO2 27 22 - 32 mmol/L   Glucose, Bld 91 65 - 99 mg/dL  BUN 7 6 - 20 mg/dL   Creatinine, Ser 0.89 0.44 - 1.00 mg/dL   Calcium 9.1 8.9 - 10.3 mg/dL   Total Protein 7.1 6.5 - 8.1 g/dL   Albumin 3.8 3.5 - 5.0 g/dL   AST 21 15 - 41 U/L   ALT 14 14 - 54 U/L   Alkaline Phosphatase 81 38 - 126 U/L   Total Bilirubin 1.0 0.3 - 1.2 mg/dL   GFR calc non Af Amer >60 >60 mL/min   GFR calc Af Amer >60 >60 mL/min    Comment: (NOTE) The eGFR has been calculated using the CKD EPI equation. This calculation has not been validated in all clinical situations. eGFR's persistently <60 mL/min signify possible Chronic Kidney Disease.    Anion gap 6 5 - 15    Comment: Performed at [facility] [address] [place], [place] [address]  CBC     Status: None   Collection Time: 08/24/17  11:13 AM  Result Value Ref Range   WBC 9.8 4.0 - 10.5 K/uL   RBC 3.94 3.87 - 5.11 MIL/uL   Hemoglobin 12.3 12.0 - 15.0 g/dL   HCT 37.7 36.0 - 46.0 %   MCV 95.7 78.0 - 100.0 fL   MCH 31.2 26.0 - 34.0 pg   MCHC 32.6 30.0 - 36.0 g/dL   RDW 13.1 11.5 - 15.5 %   Platelets 242 150 - 400 K/uL    Comment: Performed at [facility] [address], [place], [place] [address]  I-Stat beta hCG blood, ED     Status: None   Collection Time: 08/24/17 11:23 AM  Result Value Ref Range   I-stat hCG, quantitative <5.0 <5 mIU/mL   Comment 3            Comment:   GEST. AGE      CONC.  (mIU/mL)   <=1 WEEK        5 - 50     2 WEEKS       50 - 500     3 WEEKS       100 - 10,000     4 WEEKS     1,000 - 30,000        FEMALE AND NON-PREGNANT FEMALE:     LESS THAN 5 mIU/mL    Ct Abdomen Pelvis W Contrast  Result Date: 08/24/2017 CLINICAL DATA:  Abdominal pain for 2 days. EXAM: CT ABDOMEN AND PELVIS WITH CONTRAST TECHNIQUE: Multidetector CT imaging of the abdomen and pelvis was performed using the standard protocol following bolus administration of intravenous contrast. CONTRAST:  142m OMNIPAQUE IOHEXOL 300 MG/ML  SOLN COMPARISON:  None. FINDINGS: Lower chest: No acute abnormality. Hepatobiliary: No focal liver abnormality is seen. No gallstones, gallbladder wall thickening, or biliary dilatation. Pancreas: Unremarkable. No pancreatic ductal dilatation or surrounding inflammatory changes. Spleen: Normal in size without focal abnormality. Adrenals/Urinary Tract: Adrenal glands are unremarkable. Kidneys are normal, without renal calculi, focal lesion, or hydronephrosis. Bladder is unremarkable. Stomach/Bowel: Stomach is within normal limits. Dilated appendix measuring 12 mm with mucosal enhancement and mild surrounding inflammatory changes most concerning for acute appendicitis. Small amount of pelvic free fluid. No evidence of bowel wall thickening, distention, or inflammatory changes. Vascular/Lymphatic: No  significant vascular findings are present. No enlarged abdominal or pelvic lymph nodes. Reproductive: Prostate is unremarkable. Other: No abdominal wall hernia or abnormality. Musculoskeletal: No acute or significant osseous findings. IMPRESSION: 1. Findings concerning for mild early appendicitis. Small amount of  pelvic free fluid. Electronically Signed   By: Kathreen Devoid   On: 08/24/2017 14:17    Review of Systems  Constitutional: Positive for malaise/fatigue. Negative for chills and fever.  HENT: Negative for hearing loss and tinnitus.   Eyes: Negative for blurred vision and double vision.  Respiratory: Negative for cough and hemoptysis.   Cardiovascular: Negative for chest pain and palpitations.  Gastrointestinal: Positive for abdominal pain. Negative for nausea.  Genitourinary: Negative for dysuria and urgency.  Musculoskeletal: Negative for myalgias and neck pain.  Skin: Negative for itching and rash.  Neurological: Negative for dizziness.  Endo/Heme/Allergies: Does not bruise/bleed easily.  Psychiatric/Behavioral: Negative for depression and suicidal ideas.    Blood pressure 111/70, pulse 80, temperature 98.4 F (36.9 C), temperature source Oral, resp. rate 16, last menstrual period 07/27/2017, SpO2 100 %, unknown if currently breastfeeding. Physical Exam  Constitutional: She is oriented to person, place, and time. She appears well-developed and well-nourished.  HENT:  Head: Normocephalic and atraumatic.  Eyes: Pupils are equal, round, and reactive to light. Conjunctivae are normal.  Neck: Normal range of motion. Neck supple.  Cardiovascular: Normal rate and regular rhythm.  Respiratory: Effort normal and breath sounds normal.  GI: There is tenderness. There is tenderness at McBurney's point.  Musculoskeletal: Normal range of motion.  Neurological: She is alert and oriented to person, place, and time.  Skin: Skin is warm and dry.  Psychiatric: She has a normal mood and affect.  Her behavior is normal. Judgment and thought content normal.     Assessment/Plan Acute appendicitis   Discussed medical and surgical management  She has opted for laparoscopic appendectomy  The procedure has been discussed with the patient.  Alternative therapies have been discussed with the patient.  Operative risks include bleeding,  Infection,  Organ injury,  Nerve injury,  Blood vessel injury,  DVT,  Pulmonary embolism,  Death,  And possible reoperation.  Medical management risks include worsening of present situation.  The success of the procedure is 50 -90 % at treating patients symptoms.  The patient understands and agrees to proceed.  Turner Daniels, MD 08/24/2017, 3:37 PM

## 2017-08-25 ENCOUNTER — Encounter (HOSPITAL_COMMUNITY): Payer: Self-pay | Admitting: Surgery

## 2017-08-25 LAB — COMPREHENSIVE METABOLIC PANEL
ALT: 12 U/L — AB (ref 14–54)
ANION GAP: 4 — AB (ref 5–15)
AST: 18 U/L (ref 15–41)
Albumin: 3.3 g/dL — ABNORMAL LOW (ref 3.5–5.0)
Alkaline Phosphatase: 67 U/L (ref 38–126)
BUN: 8 mg/dL (ref 6–20)
CHLORIDE: 110 mmol/L (ref 101–111)
CO2: 23 mmol/L (ref 22–32)
CREATININE: 0.68 mg/dL (ref 0.44–1.00)
Calcium: 8.5 mg/dL — ABNORMAL LOW (ref 8.9–10.3)
Glucose, Bld: 150 mg/dL — ABNORMAL HIGH (ref 65–99)
POTASSIUM: 4.4 mmol/L (ref 3.5–5.1)
Sodium: 137 mmol/L (ref 135–145)
Total Bilirubin: 0.3 mg/dL (ref 0.3–1.2)
Total Protein: 6.1 g/dL — ABNORMAL LOW (ref 6.5–8.1)

## 2017-08-25 LAB — CBC
HCT: 33.9 % — ABNORMAL LOW (ref 36.0–46.0)
Hemoglobin: 10.8 g/dL — ABNORMAL LOW (ref 12.0–15.0)
MCH: 30.9 pg (ref 26.0–34.0)
MCHC: 31.9 g/dL (ref 30.0–36.0)
MCV: 97.1 fL (ref 78.0–100.0)
Platelets: 198 10*3/uL (ref 150–400)
RBC: 3.49 MIL/uL — ABNORMAL LOW (ref 3.87–5.11)
RDW: 13.2 % (ref 11.5–15.5)
WBC: 6.2 10*3/uL (ref 4.0–10.5)

## 2017-08-25 MED ORDER — ACETAMINOPHEN 500 MG PO TABS: 1000.0000 mg | ORAL_TABLET | Freq: Three times a day (TID) | ORAL | 0 refills | Status: DC | PRN

## 2017-08-25 MED ORDER — IBUPROFEN 800 MG PO TABS: 800.0000 mg | ORAL_TABLET | Freq: Three times a day (TID) | ORAL | 0 refills | Status: DC | PRN

## 2017-08-25 MED ORDER — TRAMADOL HCL 50 MG PO TABS: 50.0000 mg | ORAL_TABLET | Freq: Four times a day (QID) | ORAL | 0 refills | Status: DC | PRN

## 2017-08-25 NOTE — Discharge Instructions (Signed)
Please arrive at least 30 min before your appointment to complete your check in paperwork.  If you are unable to arrive 30 min prior to your appointment time we may have to cancel or reschedule you. ° °LAPAROSCOPIC SURGERY: POST OP INSTRUCTIONS  °1. DIET: Follow a light bland diet the first 24 hours after arrival home, such as soup, liquids, crackers, etc. Be sure to include lots of fluids daily. Avoid fast food or heavy meals as your are more likely to get nauseated. Eat a low fat the next few days after surgery.  °2. Take your usually prescribed home medications unless otherwise directed. °3. PAIN CONTROL:  °1. Pain is best controlled by a usual combination of three different methods TOGETHER:  °1. Ice/Heat °2. Over the counter pain medication °3. Prescription pain medication °2. Most patients will experience some swelling and bruising around the incisions. Ice packs or heating pads (30-60 minutes up to 6 times a day) will help. Use ice for the first few days to help decrease swelling and bruising, then switch to heat to help relax tight/sore spots and speed recovery. Some people prefer to use ice alone, heat alone, alternating between ice & heat. Experiment to what works for you. Swelling and bruising can take several weeks to resolve.  °3. It is helpful to take an over-the-counter pain medication regularly for the first few weeks. Choose one of the following that works best for you:  °1. Naproxen (Aleve, etc) Two 220mg tabs twice a day °2. Ibuprofen (Advil, etc) Three 200mg tabs four times a day (every meal & bedtime) °3. Acetaminophen (Tylenol, etc) 500-650mg four times a day (every meal & bedtime) °4. A prescription for pain medication (such as oxycodone, hydrocodone, etc) should be given to you upon discharge. Take your pain medication as prescribed.  °1. If you are having problems/concerns with the prescription medicine (does not control pain, nausea, vomiting, rash, itching, etc), please call us [phone]  [phone] to see if we need to switch you to a different pain medicine that will work better for you and/or control your side effect better. °2. If you need a refill on your pain medication, please contact your pharmacy. They will contact our office to request authorization. Prescriptions will not be filled after 5 pm or on week-ends. °4. Avoid getting constipated. Between the surgery and the pain medications, it is common to experience some constipation. Increasing fluid intake and taking a fiber supplement (such as Metamucil, Citrucel, FiberCon, MiraLax, etc) 1-2 times a day regularly will usually help prevent this problem from occurring. A mild laxative (prune juice, Milk of Magnesia, MiraLax, etc) should be taken according to package directions if there are no bowel movements after 48 hours.  °5. Watch out for diarrhea. If you have many loose bowel movements, simplify your diet to bland foods & liquids for a few days. Stop any stool softeners and decrease your fiber supplement. Switching to mild anti-diarrheal medications (Kayopectate, Pepto Bismol) can help. If this worsens or does not improve, please call us. °6. Wash / shower every day. You may shower over the dressings as they are waterproof. Continue to shower over incision(s) after the dressing is off. °7. Remove your waterproof bandages 5 days after surgery. You may leave the incision open to air. You may replace a dressing/Band-Aid to cover the incision for comfort if you wish.  °8. ACTIVITIES as tolerated:  °1. You may resume regular (light) daily activities beginning the next day--such as daily [place]-care, walking, climbing stairs--gradually   increasing activities as tolerated. If you can walk 30 minutes without difficulty, it is safe to try more intense activity such as jogging, treadmill, bicycling, low-impact aerobics, swimming, etc. °2. Save the most intensive and strenuous activity for last such as sit-ups, heavy lifting, contact sports, etc Refrain  from any heavy lifting or straining until you are off narcotics for pain control.  °3. DO NOT PUSH THROUGH PAIN. Let pain be your guide: If it hurts to do something, don't do it. Pain is your body warning you to avoid that activity for another week until the pain goes down. °4. You may drive when you are no longer taking prescription pain medication, you can comfortably wear a seatbelt, and you can safely maneuver your car and apply brakes. °5. You may have sexual intercourse when it is comfortable.  °9. FOLLOW UP in our office  °1. Please call [facility] at [phone] to set up an appointment to see your surgeon in the office for a follow-up appointment approximately 2-3 weeks after your surgery. °2. Make sure that you call for this appointment the day you arrive home to insure a convenient appointment time. °     10. IF YOU HAVE DISABILITY OR FAMILY LEAVE FORMS, BRING THEM TO THE               OFFICE FOR PROCESSING.  ° °WHEN TO CALL US [phone]:  °1. Poor pain control °2. Reactions / problems with new medications (rash/itching, nausea, etc)  °3. Fever over 101.5 F (38.5 C) °4. Inability to urinate °5. Nausea and/or vomiting °6. Worsening swelling or bruising °7. Continued bleeding from incision. °8. Increased pain, redness, or drainage from the incision ° °The clinic staff is available to answer your questions during regular business hours (8:30am-5pm). Please don’t hesitate to call and ask to speak to one of our nurses for clinical concerns.  °If you have a medical emergency, go to the nearest emergency room or call 911.  °A surgeon from [facility] is always on call at the hospitals  ° °[facility]  °[address], [address], [place], [place] [address] ?  °MAIN: [phone] ? TOLL FREE: [phone] ?  °FAX [phone]  °[email or web address] ° °

## 2017-08-25 NOTE — Progress Notes (Signed)
discharg ehome. Home discharg einstruction given, no question verbalized,.

## 2017-08-25 NOTE — Discharge Summary (Signed)
[facility] Discharge Summary   Patient ID: [name] [name] MRN: [id number] DOB/AGE: [birth year] 32 y.o.  Admit date: 08/24/2017 Discharge date: 08/25/2017  Admitting Diagnosis: Acute appendicitis  Discharge Diagnosis S/P laparoscopic appendectomy   Consultants None  Imaging: Ct Abdomen Pelvis W Contrast  Result Date: 08/24/2017 CLINICAL DATA:  Abdominal pain for 2 days. EXAM: CT ABDOMEN AND PELVIS WITH CONTRAST TECHNIQUE: Multidetector CT imaging of the abdomen and pelvis was performed using the standard protocol following bolus administration of intravenous contrast. CONTRAST:  176mL OMNIPAQUE IOHEXOL 300 MG/ML  SOLN COMPARISON:  None. FINDINGS: Lower chest: No acute abnormality. Hepatobiliary: No focal liver abnormality is seen. No gallstones, gallbladder wall thickening, or biliary dilatation. Pancreas: Unremarkable. No pancreatic ductal dilatation or surrounding inflammatory changes. Spleen: Normal in size without focal abnormality. Adrenals/Urinary Tract: Adrenal glands are unremarkable. Kidneys are normal, without renal calculi, focal lesion, or hydronephrosis. Bladder is unremarkable. Stomach/Bowel: Stomach is within normal limits. Dilated appendix measuring 12 mm with mucosal enhancement and mild surrounding inflammatory changes most concerning for acute appendicitis. Small amount of pelvic free fluid. No evidence of bowel wall thickening, distention, or inflammatory changes. Vascular/Lymphatic: No significant vascular findings are present. No enlarged abdominal or pelvic lymph nodes. Reproductive: Prostate is unremarkable. Other: No abdominal wall hernia or abnormality. Musculoskeletal: No acute or significant osseous findings. IMPRESSION: 1. Findings concerning for mild early appendicitis. Small amount of pelvic free fluid. Electronically Signed   By: Kathreen Devoid   On: 08/24/2017 14:17    Procedures Dr. Brantley Stage (08/24/17) - Laparoscopic Appendectomy  Hospital Course:   Patient is a 32 year old female who presented to [facility] with abdominal pain.  Workup showed acute appendicitis.  Patient was admitted and underwent procedure listed above.  Tolerated procedure well and was transferred to the floor.  Diet was advanced as tolerated.  On POD#1, the patient was voiding well, tolerating diet, ambulating well, pain well controlled, vital signs stable, incisions c/d/i and felt stable for discharge home.  Patient will follow up in our office in 2 weeks and knows to call with questions or concerns. She will call to confirm appointment date/time.    Physical Exam: General:  Alert, NAD, pleasant, comfortable Abd:  Soft, ND, no tenderness, incisions C/D/I   Allergies as of 08/25/2017   No Known Allergies     Medication List    STOP taking these medications   oxyCODONE 5 MG immediate release tablet Commonly known as:  Oxy IR/ROXICODONE   sulfamethoxazole-trimethoprim 800-160 MG tablet Commonly known as:  BACTRIM DS,SEPTRA DS     TAKE these medications   acetaminophen 500 MG tablet Commonly known as:  TYLENOL Take 2 tablets (1,000 mg total) by mouth every 8 (eight) hours as needed for mild pain.   albuterol 108 (90 Base) MCG/ACT inhaler Commonly known as:  PROVENTIL HFA;VENTOLIN HFA Inhale 1 puff into the lungs every 6 (six) hours as needed for wheezing or shortness of breath.   ibuprofen 800 MG tablet Commonly known as:  ADVIL,MOTRIN Take 1 tablet (800 mg total) by mouth every 8 (eight) hours as needed for mild pain. Take with food. What changed:    reasons to take this  additional instructions   Iron 325 (65 Fe) MG Tabs Take 1 tablet by mouth at bedtime.   prenatal multivitamin Tabs tablet Take 1 tablet by mouth daily at 12 noon.   traMADol 50 MG tablet Commonly known as:  ULTRAM Take 1 tablet (50 mg total) by mouth every 6 (six) hours  as needed (mild pain).        Follow-up Information    [facility], [facility]. Go on 09/14/2017.    Specialty:  General Surgery Why:  Follow up appointment scheduled for 8:30 AM. Please arrive by 8:00 AM for check in. Bring photo ID and insurance information.  Contact information: [place] [address] [phone]           Signed: Brigid Re, [facility] 08/25/2017, 8:11 AM Pager: [phone] Consults: [phone] Mon-Fri 7:00 am-4:30 pm Sat-Sun 7:00 am-11:30 am

## 2017-08-26 ENCOUNTER — Emergency Department (HOSPITAL_COMMUNITY): Payer: 59

## 2017-08-26 ENCOUNTER — Other Ambulatory Visit: Payer: Self-pay

## 2017-08-26 ENCOUNTER — Emergency Department (HOSPITAL_COMMUNITY)
Admission: EM | Admit: 2017-08-26 | Discharge: 2017-08-26 | Disposition: A | Discharge status: Home / Self Care | Payer: 59 | Attending: Emergency Medicine | Admitting: Emergency Medicine

## 2017-08-26 ENCOUNTER — Encounter (HOSPITAL_COMMUNITY): Payer: Self-pay | Admitting: Emergency Medicine

## 2017-08-26 DIAGNOSIS — I10 Essential (primary) hypertension: Secondary | ICD-10-CM | POA: Diagnosis not present

## 2017-08-26 DIAGNOSIS — J45909 Unspecified asthma, uncomplicated: Secondary | ICD-10-CM | POA: Insufficient documentation

## 2017-08-26 DIAGNOSIS — Z79899 Other long term (current) drug therapy: Secondary | ICD-10-CM | POA: Insufficient documentation

## 2017-08-26 DIAGNOSIS — R0602 Shortness of breath: Secondary | ICD-10-CM | POA: Insufficient documentation

## 2017-08-26 DIAGNOSIS — R079 Chest pain, unspecified: Secondary | ICD-10-CM | POA: Insufficient documentation

## 2017-08-26 LAB — CBC WITH DIFFERENTIAL/PLATELET
ABS IMMATURE GRANULOCYTES: 0 10*3/uL (ref 0.0–0.1)
BASOS ABS: 0 10*3/uL (ref 0.0–0.1)
BASOS PCT: 1 %
Eosinophils Absolute: 0.1 10*3/uL (ref 0.0–0.7)
Eosinophils Relative: 2 %
HCT: 37.5 % (ref 36.0–46.0)
HEMOGLOBIN: 12.1 g/dL (ref 12.0–15.0)
Immature Granulocytes: 0 %
LYMPHS PCT: 44 %
Lymphs Abs: 2.6 10*3/uL (ref 0.7–4.0)
MCH: 31.3 pg (ref 26.0–34.0)
MCHC: 32.3 g/dL (ref 30.0–36.0)
MCV: 96.9 fL (ref 78.0–100.0)
Monocytes Absolute: 0.4 10*3/uL (ref 0.1–1.0)
Monocytes Relative: 6 %
NEUTROS ABS: 2.8 10*3/uL (ref 1.7–7.7)
NEUTROS PCT: 47 %
Platelets: 255 10*3/uL (ref 150–400)
RBC: 3.87 MIL/uL (ref 3.87–5.11)
RDW: 13.2 % (ref 11.5–15.5)
WBC: 5.9 10*3/uL (ref 4.0–10.5)

## 2017-08-26 LAB — BASIC METABOLIC PANEL
Anion gap: 9 (ref 5–15)
BUN: 11 mg/dL (ref 6–20)
CALCIUM: 9.5 mg/dL (ref 8.9–10.3)
CO2: 29 mmol/L (ref 22–32)
Chloride: 102 mmol/L (ref 101–111)
Creatinine, Ser: 0.9 mg/dL (ref 0.44–1.00)
Glucose, Bld: 95 mg/dL (ref 65–99)
POTASSIUM: 4.3 mmol/L (ref 3.5–5.1)
SODIUM: 140 mmol/L (ref 135–145)

## 2017-08-26 LAB — I-STAT BETA HCG BLOOD, ED (MC, WL, AP ONLY)

## 2017-08-26 LAB — BRAIN NATRIURETIC PEPTIDE: B Natriuretic Peptide: 91 pg/mL (ref 0.0–100.0)

## 2017-08-26 LAB — I-STAT TROPONIN, ED: TROPONIN I, POC: 0 ng/mL (ref 0.00–0.08)

## 2017-08-26 MED ORDER — IOPAMIDOL (ISOVUE-370) INJECTION 76%
100.0000 mL | Freq: Once | INTRAVENOUS | Status: AC | PRN
  Administered 2017-08-26: 100 mL via INTRAVENOUS

## 2017-08-26 MED ORDER — SODIUM CHLORIDE 0.9 % IV SOLN
INTRAVENOUS | Status: DC
  Administered 2017-08-26: 19:00:00 via INTRAVENOUS

## 2017-08-26 MED ORDER — IPRATROPIUM-ALBUTEROL 0.5-2.5 (3) MG/3ML IN SOLN
3.0000 mL | Freq: Once | RESPIRATORY_TRACT | Status: AC
  Administered 2017-08-26: 3 mL via RESPIRATORY_TRACT
  Filled 2017-08-26: qty 3

## 2017-08-26 MED ORDER — IOPAMIDOL (ISOVUE-370) INJECTION 76%
INTRAVENOUS | Status: AC
  Filled 2017-08-26: qty 100

## 2017-08-26 NOTE — ED Notes (Signed)
[facility] EDP made aware that lab does not have d-dimer.  He stated it's not needed.

## 2017-08-26 NOTE — ED Provider Notes (Signed)
[facility] EMERGENCY DEPARTMENT Provider Note   CSN: [id number] Arrival date & time: 08/26/17  1521     History   Chief Complaint No chief complaint on file.   HPI [name] [name] is a 32 y.o. female.  Patient status post laparoscopic appendectomy on June 18.  Patient discharged home to yesterday.  Patient here today with shortness of breath and chest pain.  Went to primary care provider some concerns for possible cardiac event and was referred in for further evaluation.  Patient did have chest discomfort when she was discharged from the hospital yesterday.  Got worse today.  Patient did try her inhalers at home with a little bit of relief.  Patient is feeling anxious as well has a history of asthma but did not feel as if she was wheezing.  Also endorses some lightheadedness.  No real abdominal pain.  Patient has an infant at home but is being breast-fed.     Past Medical History:  Diagnosis Date  . Anxiety   . Asthma   . Depression 2016   "after daughter was stillborn" (08/24/2017)  . Gestational diabetes 2018  . Graves disease   . Hypertension 2016   "during pregnancy only" (08/24/2017)  . Postpartum depression 2018  . Psoriatic arthritis [facility])     Patient Active Problem List   Diagnosis Date Noted  . Acute appendicitis 08/24/2017  . History of IUFD 10-04-2016  . S/P cesarean section October 04, 2016  . Intrauterine fetal death Sep 10, 2014  . Fetal demise 08/19/2014  . [redacted] weeks gestation of pregnancy     Past Surgical History:  Procedure Laterality Date  . APPENDECTOMY  08/24/2017  . CESAREAN SECTION N/A 10/04/2016   Procedure: CESAREAN SECTION;  Surgeon: Janyth Contes, MD;  Location: [place];  Service: Obstetrics;  Laterality: N/A;  . LAPAROSCOPIC APPENDECTOMY N/A 08/24/2017   Procedure: APPENDECTOMY LAPAROSCOPIC;  Surgeon: Erroll Luna, MD;  Location: [place];  Service: General;  Laterality: N/A;  . WISDOM TOOTH EXTRACTION  2004      OB History    Gravida  2   Para  1   Term      Preterm  1   AB      Living  0     SAB      TAB      Ectopic      Multiple  0   Live Births               Home Medications    Prior to Admission medications   Medication Sig Start Date End Date Taking? Authorizing Provider  acetaminophen (TYLENOL) 500 MG tablet Take 2 tablets (1,000 mg total) by mouth every 8 (eight) hours as needed for mild pain. 08/25/17  Yes Rayburn, Claiborne Billings A, PA-C  albuterol (PROVENTIL HFA;VENTOLIN HFA) 108 (90 BASE) MCG/ACT inhaler Inhale 1 puff into the lungs every 6 (six) hours as needed for wheezing or shortness of breath.   Yes [provider]  ibuprofen (ADVIL,MOTRIN) 800 MG tablet Take 1 tablet (800 mg total) by mouth every 8 (eight) hours as needed for mild pain. Take with food. 08/25/17  Yes Rayburn, Floyce Stakes, PA-C  Prenatal Vit-Fe Fumarate-FA (PRENATAL MULTIVITAMIN) TABS tablet Take 1 tablet by mouth daily at 12 noon. 09/16/16  Yes Bovard-Stuckert, Jody, MD  traMADol (ULTRAM) 50 MG tablet Take 1 tablet (50 mg total) by mouth every 6 (six) hours as needed (mild pain). 08/25/17  Yes Rayburn, Floyce Stakes, PA-C  Ferrous  Sulfate (IRON) 325 (65 Fe) MG TABS Take 1 tablet by mouth at bedtime. Patient not taking: Reported on 08/24/2017 09/16/16   Janyth Contes, MD    Family History Family History  Problem Relation Age of Onset  . Cancer Maternal Grandmother   . Heart disease Maternal Grandmother     Social History Social History   Tobacco Use  . Smoking status: Never Smoker  . Smokeless tobacco: Never Used  Substance Use Topics  . Alcohol use: Yes    Alcohol/week: 1.8 oz    Types: 3 Glasses of wine per week  . Drug use: Never     Allergies   Cats claw [uncaria tomentosa (cats claw)] and Mold extract [trichophyton]   Review of Systems Review of Systems  Constitutional: Negative for fever.  HENT: Negative for congestion.   Eyes: Negative for visual disturbance.   Respiratory: Positive for shortness of breath.   Cardiovascular: Positive for chest pain. Negative for leg swelling.  Gastrointestinal: Negative for abdominal pain, nausea and vomiting.  Genitourinary: Negative for dysuria.  Musculoskeletal: Negative for back pain.  Neurological: Positive for light-headedness. Negative for syncope.  Hematological: Does not bruise/bleed easily.  Psychiatric/Behavioral: Negative for confusion.     Physical Exam Updated Vital Signs BP 109/67   Pulse 79   Temp 98.2 F (36.8 C) (Oral)   Resp 18   LMP 07/27/2017   SpO2 100%   Physical Exam  Constitutional: She is oriented to person, place, and time. She appears well-developed and well-nourished. No distress.  HENT:  Head: Normocephalic and atraumatic.  Mouth/Throat: Oropharynx is clear and moist.  Eyes: Pupils are equal, round, and reactive to light. Conjunctivae and EOM are normal.  Neck: Neck supple.  Cardiovascular: Normal rate, regular rhythm and normal heart sounds.  Pulmonary/Chest: Effort normal and breath sounds normal. No respiratory distress. She has no wheezes.  Abdominal: Soft. Bowel sounds are normal.  Slight tenderness to abdomen the laparoscopic wounds healing well no evidence of any infection.  No abdominal distention no significant abdominal tenderness.  Musculoskeletal: Normal range of motion. She exhibits no edema.  Neurological: She is alert and oriented to person, place, and time. No cranial nerve deficit or sensory deficit. She exhibits normal muscle tone. Coordination normal.  Skin: Skin is warm. No rash noted.  Nursing note and vitals reviewed.    ED Treatments / Results  Labs (all labs ordered are listed, but only abnormal results are displayed) Labs Reviewed  BASIC METABOLIC PANEL  CBC WITH DIFFERENTIAL/PLATELET  BRAIN NATRIURETIC PEPTIDE  D-DIMER, QUANTITATIVE (NOT AT [facility])  I-STAT TROPONIN, ED  I-STAT BETA HCG BLOOD, ED ([facility], [facility], [facility] ONLY)    EKG EKG  Interpretation  Date/Time:  Thursday August 26 2017 15:37:53 EDT Ventricular Rate:  72 PR Interval:  136 QRS Duration: 88 QT Interval:  398 QTC Calculation: 435 R Axis:   83 Text Interpretation:  Normal sinus rhythm Normal ECG Confirmed by Fredia Sorrow [id number]) on 08/26/2017 5:32:52 PM   Radiology Dg Chest 2 View  Result Date: 08/26/2017 CLINICAL DATA:  Chest pain EXAM: CHEST - 2 VIEW COMPARISON:  10/15/2014 FINDINGS: The heart size and mediastinal contours are within normal limits. Both lungs are clear. The visualized skeletal structures are unremarkable. IMPRESSION: No active cardiopulmonary disease. Electronically Signed   By: Inez Catalina M.D.   On: 08/26/2017 16:22   Ct Angio Chest Pe W/cm &/or Wo Cm  Result Date: 08/26/2017 CLINICAL DATA:  32 year old female status post appendectomy yesterday. New severe chest pain,  pressure. Shortness of breath. EXAM: CT ANGIOGRAPHY CHEST WITH CONTRAST TECHNIQUE: Multidetector CT imaging of the chest was performed using the standard protocol during bolus administration of intravenous contrast. Multiplanar CT image reconstructions and MIPs were obtained to evaluate the vascular anatomy. CONTRAST:  142mL ISOVUE-370 IOPAMIDOL (ISOVUE-370) INJECTION 76% COMPARISON:  CT Abdomen and Pelvis 08/24/2017. Chest radiographs 1605 hours today. FINDINGS: Cardiovascular: Good contrast bolus timing in the pulmonary arterial tree. No focal filling defect identified in the pulmonary arteries to suggest acute pulmonary embolism. Negative visible aorta.  No cardiomegaly or pericardial effusion. Mediastinum/Nodes: Negative.  No lymphadenopathy. Lungs/Pleura: The major airways are patent. Lower lung volumes. The lungs appear normal aside from minor mostly dependent atelectasis. No pleural effusion. No pneumothorax. Upper Abdomen: Trace pneumoperitoneum under the dome of the diaphragm. Negative visible liver, spleen, pancreas, adrenal glands, kidneys, and left upper quadrant  bowel. Musculoskeletal: Negative. Review of the MIP images confirms the above findings. IMPRESSION: 1.  Negative for acute pulmonary embolus. 2. Negative CTA appearance of the chest; minor pulmonary atelectasis. 3. Post appendectomy trace pneumoperitoneum in the upper abdomen. Electronically Signed   By: Genevie Ann M.D.   On: 08/26/2017 19:57    Procedures Procedures (including critical care time)  Medications Ordered in ED Medications  0.9 %  sodium chloride infusion ( Intravenous New Bag/Given 08/26/17 1912)  iopamidol (ISOVUE-370) 76 % injection (has no administration in time range)  iopamidol (ISOVUE-370) 76 % injection 100 mL (100 mLs Intravenous Contrast Given 08/26/17 1934)  ipratropium-albuterol (DUONEB) 0.5-2.5 (3) MG/3ML nebulizer solution 3 mL (3 mLs Nebulization Given 08/26/17 2031)     Initial Impression / Assessment and Plan / ED Course  I have reviewed the triage vital signs and the nursing notes.  Pertinent labs & imaging results that were available during my care of the patient were reviewed by me and considered in my medical decision making (see chart for details).     Work-up here troponin negative and since symptoms started yesterday and like it is a cardiac event.  EKG without acute findings.  No hypoxia.  Chest x-ray negative.  However since she had recent surgery wanted to rule out pulmonary embolus.  Patient was given option to have d-dimer first but she wanted just to have CT angios done that showed no evidence of pulmonary embolus.  Patient was given an nebulizer treatment albuterol and Atrovent which improved her breathing significantly.  Again CT angios head no acute findings no evidence of pulmonary embolus.  Patient stable for discharge home.  She will use her albuterol inhaler at home.  At the time of discharge patient felt much better.  Final Clinical Impressions(s) / ED Diagnoses   Final diagnoses:  SOB (shortness of breath)    ED Discharge Orders    None         Fredia Sorrow, MD 08/26/17 2222

## 2017-08-26 NOTE — Discharge Instructions (Addendum)
Continue usual albuterol inhaler at home.  Follow-up with your doctors.  Return for any new or worse symptoms.

## 2017-08-26 NOTE — ED Notes (Signed)
Pt is resting appears comfortable.  Pt reports pain only when taking a deep breath.  She is A&Ox 4.

## 2017-08-26 NOTE — ED Provider Notes (Signed)
Patient placed in Quick Look pathway, seen and evaluated   Chief Complaint: + sob,  HPI:    went to walk in clinic prior and given albuterol, S/p lap appendectomy yesterday, left with sob and chest pressure, today feels sob but does not have exertional dyspnea.No leg swelling no hx of blood clots, no fevers or chills. No OCPS  ROS: sob (one)  Physical Exam:   Gen: No distress  Neuro: Awake and Alert  Skin: Warm    Focused Exam:Ctab. RRR   Initiation of care has begun. The patient has been counseled on the process, plan, and necessity for staying for the completion/evaluation, and the remainder of the medical screening examination    Margarita Mail, PA-C 08/26/17 Bradshaw, Darling, MD 08/26/17 2057

## 2017-08-26 NOTE — Anesthesia Postprocedure Evaluation (Signed)
Anesthesia Post Note  Patient: [name] [name]  Procedure(s) Performed: APPENDECTOMY LAPAROSCOPIC (N/A )     Patient location during evaluation: PACU Anesthesia Type: General Level of consciousness: awake and alert Pain management: pain level controlled Vital Signs Assessment: post-procedure vital signs reviewed and stable Respiratory status: spontaneous breathing, nonlabored ventilation and respiratory function stable Cardiovascular status: blood pressure returned to baseline and stable Postop Assessment: no apparent nausea or vomiting Anesthetic complications: no    Last Vitals:  Vitals:   08/25/17 0536 08/25/17 0809  BP: 91/60 (!) 89/59  Pulse: (!) 45 (!) 51  Resp: 16 18  Temp: (!) 36.2 C 36.4 C  SpO2: 98% 99%    Last Pain:  Vitals:   08/25/17 0809  TempSrc: Oral  PainSc:                  Shyniece Scripter,W. EDMOND

## 2017-08-26 NOTE — ED Triage Notes (Addendum)
Pt reports she had an appendectomy yesterday, left yesterday from hospital with chest pain, states she reported a feeling that an elephant was sitting on her chest. Pt reports chest tightness with shortness of breath, trouble getting her words out. Pt states she feels anxious and has hx of asthma but does not think this is it. Pt also endorses lightheadedness.

## 2017-09-23 DIAGNOSIS — R946 Abnormal results of thyroid function studies: Secondary | ICD-10-CM | POA: Diagnosis not present

## 2017-09-23 DIAGNOSIS — R300 Dysuria: Secondary | ICD-10-CM | POA: Diagnosis not present

## 2017-09-23 DIAGNOSIS — R1030 Lower abdominal pain, unspecified: Secondary | ICD-10-CM | POA: Diagnosis not present

## 2017-09-23 DIAGNOSIS — Z9049 Acquired absence of other specified parts of digestive tract: Secondary | ICD-10-CM | POA: Diagnosis not present

## 2017-10-01 ENCOUNTER — Other Ambulatory Visit: Payer: Self-pay | Admitting: Surgery

## 2017-10-01 DIAGNOSIS — Z9049 Acquired absence of other specified parts of digestive tract: Secondary | ICD-10-CM

## 2017-10-18 DIAGNOSIS — Z130 Encounter for screening for diseases of the blood and blood-forming organs and certain disorders involving the immune mechanism: Secondary | ICD-10-CM | POA: Diagnosis not present

## 2017-10-18 DIAGNOSIS — Z01419 Encounter for gynecological examination (general) (routine) without abnormal findings: Secondary | ICD-10-CM | POA: Diagnosis not present

## 2017-10-18 DIAGNOSIS — Z6822 Body mass index (BMI) 22.0-22.9, adult: Secondary | ICD-10-CM | POA: Diagnosis not present

## 2018-01-06 DIAGNOSIS — B078 Other viral warts: Secondary | ICD-10-CM | POA: Diagnosis not present

## 2018-01-06 DIAGNOSIS — Z86018 Personal history of other benign neoplasm: Secondary | ICD-10-CM | POA: Diagnosis not present

## 2018-01-06 DIAGNOSIS — D2271 Melanocytic nevi of right lower limb, including hip: Secondary | ICD-10-CM | POA: Diagnosis not present

## 2018-02-17 DIAGNOSIS — E0500 Thyrotoxicosis with diffuse goiter without thyrotoxic crisis or storm: Secondary | ICD-10-CM | POA: Diagnosis not present

## 2018-02-24 DIAGNOSIS — N898 Other specified noninflammatory disorders of vagina: Secondary | ICD-10-CM | POA: Diagnosis not present

## 2018-03-09 NOTE — L&D Delivery Note (Signed)
Delivery Note Pt reached complete dilation and pushed great for about 20 minutes.  At 1:58 PM a healthy female was delivered via VBAC, Spontaneous (Presentation: LOA).  APGAR: 8,9 ; weight pending.   Placenta status: Spontaneous, Intact.  Cord: 3 vessels with the following complications: None.   Anesthesia: Epidural Episiotomy: None Lacerations:  Second degree Suture Repair: 3.0 vicryl rapide Est. Blood Loss (mL):  318mL  Mom to postpartum.  Baby to Couplet care / Skin to Skin.  D/w pt circumcision and desires to proceed in the hospital  Logan Bores 02/21/2019, 2:30 PM

## 2018-04-16 IMAGING — US US OB COMP LESS 14 WK
1 series · 15 of 28 positions shown · non-contrast
Comparison: None.

CLINICAL DATA: Lower abdominal pain

EXAM:
OBSTETRIC <14 WK US AND TRANSVAGINAL OB US
TECHNIQUE: Both transabdominal and transvaginal ultrasound examinations were
performed for complete evaluation of the gestation as well as the
maternal uterus, adnexal regions, and pelvic cul-de-sac.
Transvaginal technique was performed to assess early pregnancy.

[Series 1: us ob comp less 14 wk · 15 of 61 slices shown]
[im 1/61]
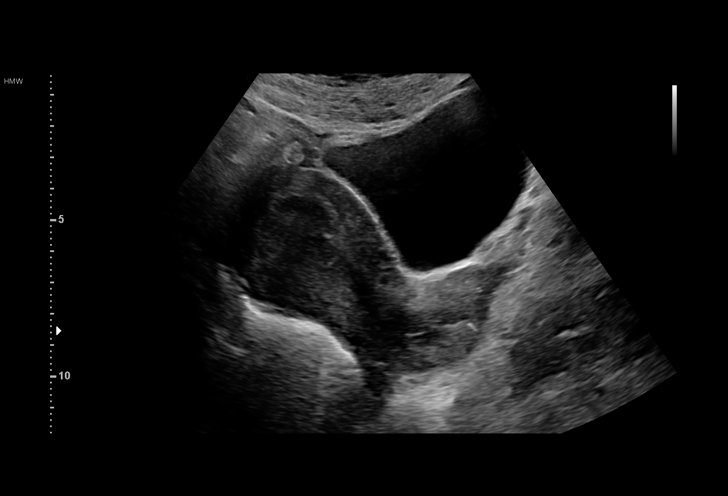
[im 5/61]
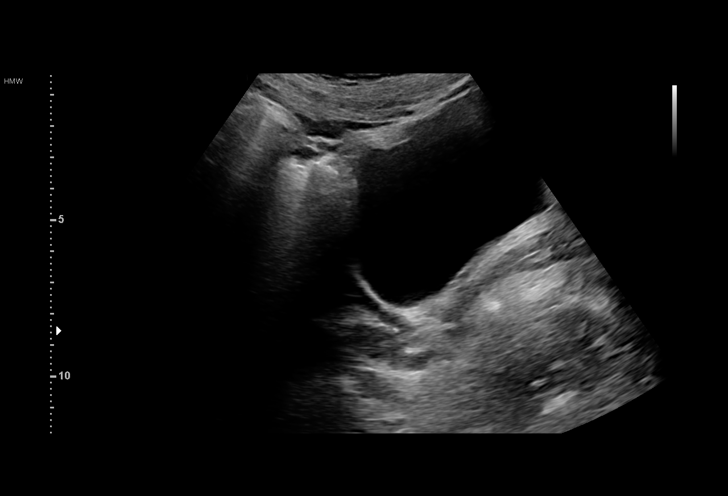
[im 9/61]
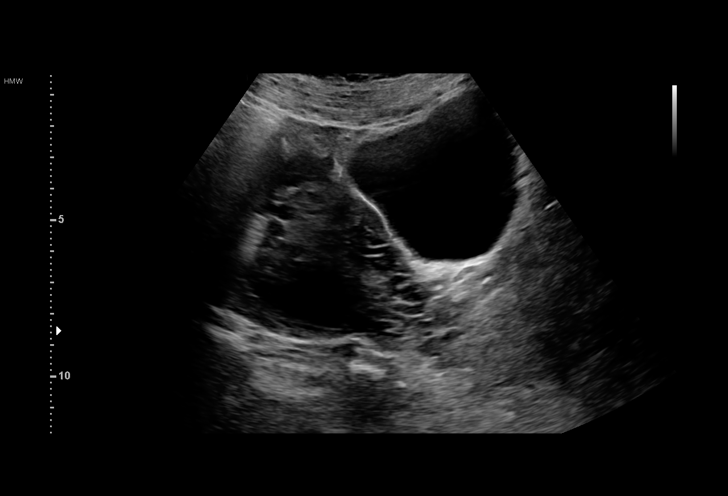
[im 14/61]
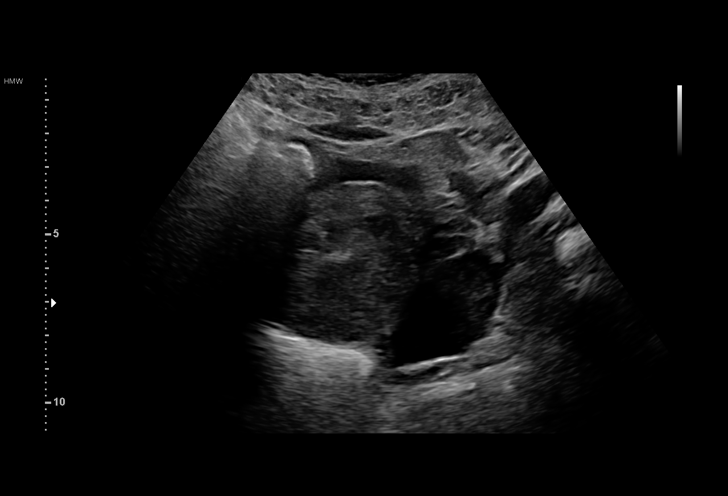
[im 18/61]
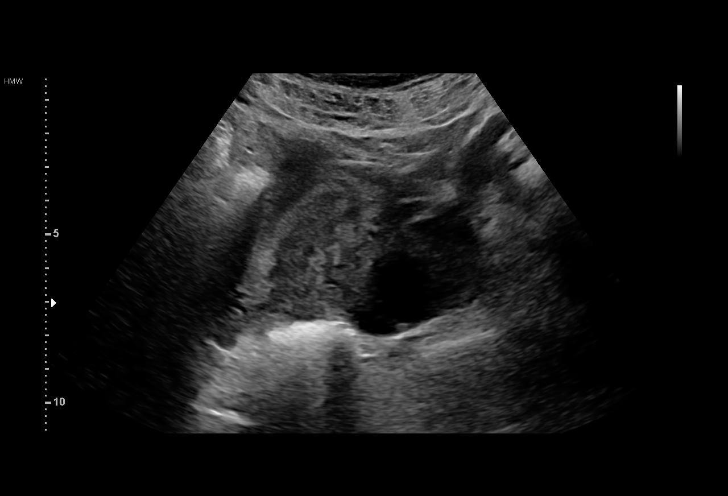
[im 23/61]
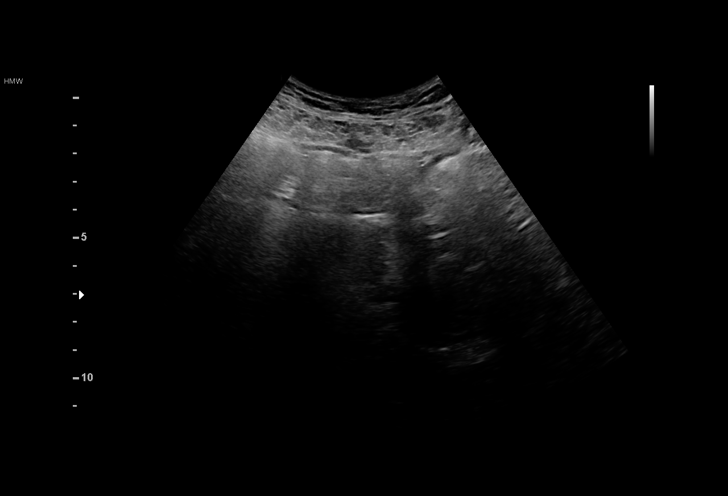
[im 27/61]
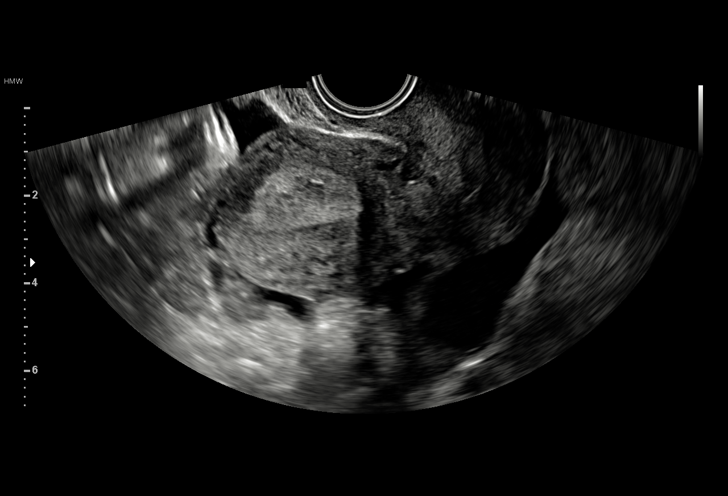
[im 32/61]
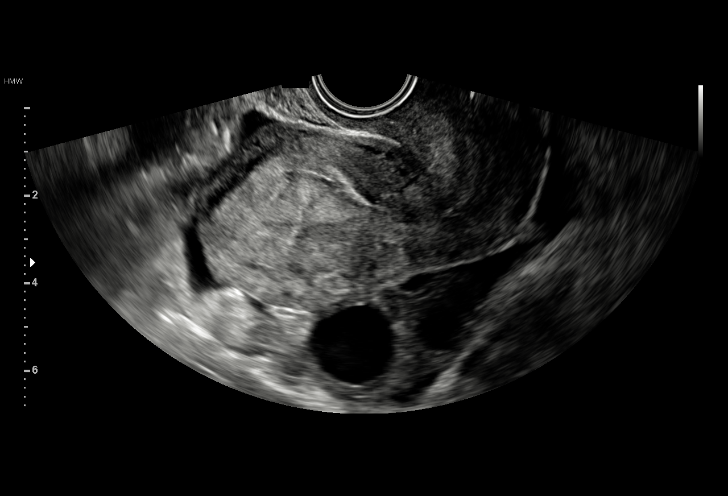
[im 34/61]
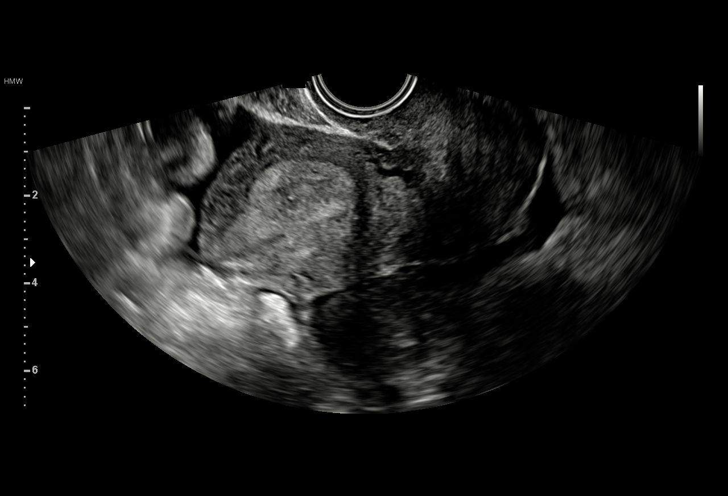
[im 38/61]
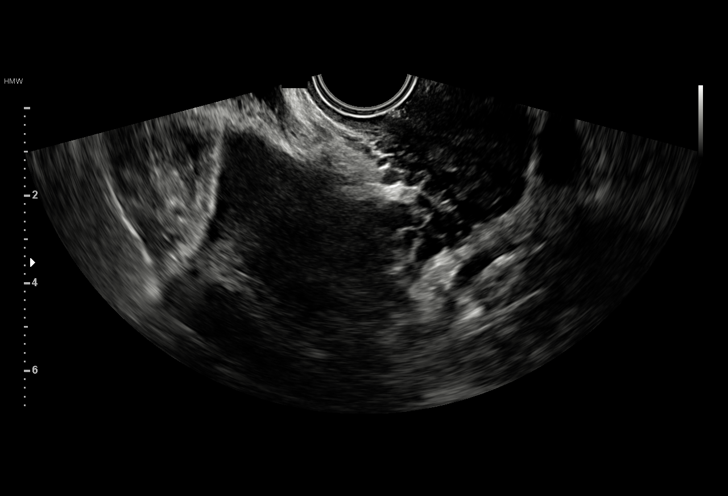
[im 43/61]
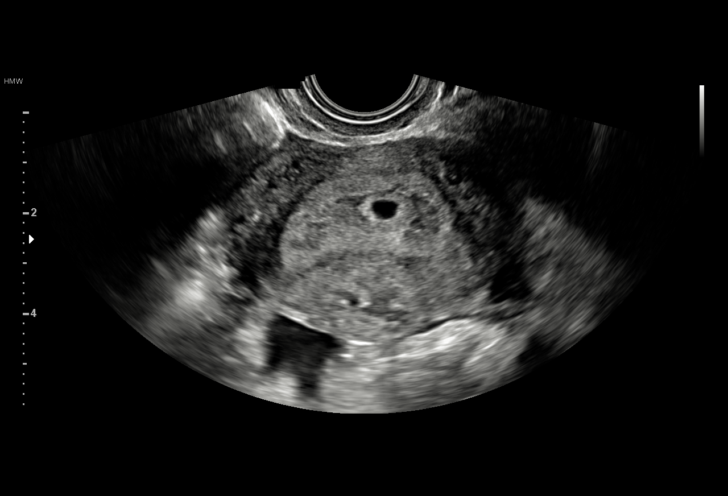
[im 47/61]
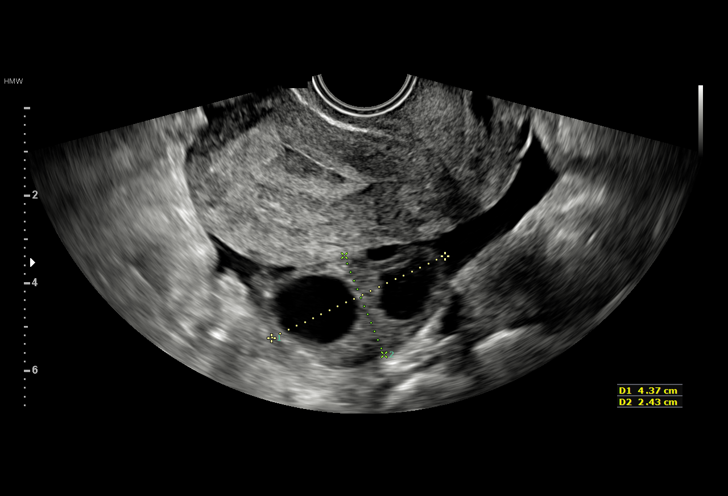
[im 52/61]
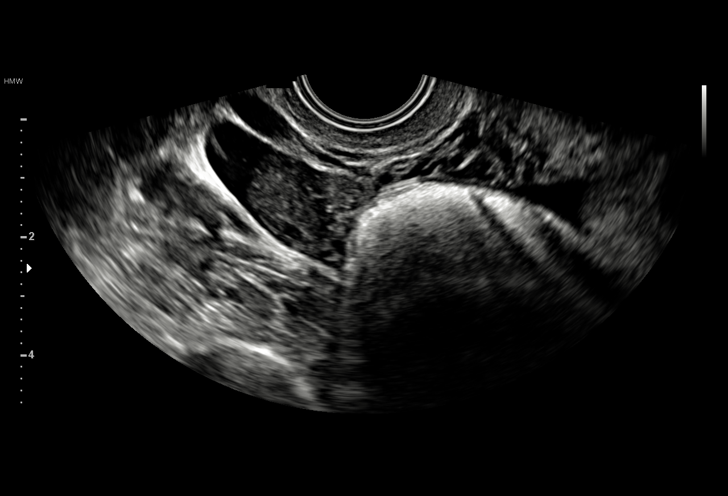
[im 56/61]
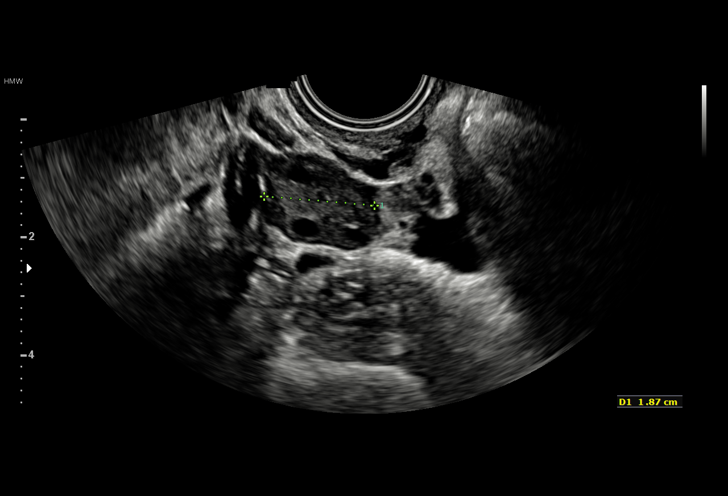
[im 61/61]
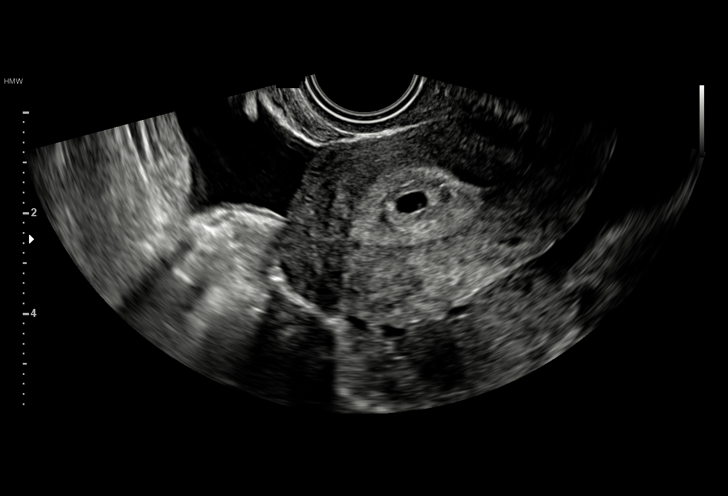

[15 of 28 positions shown; findings below may reference images not displayed]

FINDINGS: Intrauterine gestational sac: Single probable intrauterine
gestational sac visualized.

Yolk sac:  Not visualized

Embryo:  Not visualized

Cardiac Activity: Not visualized

MSD: 6.1  mm   5 w   2  d

Subchorionic hemorrhage:  None visualized.

Maternal uterus/adnexae: Maternal uterus grossly unremarkable. The
bilateral ovaries are within normal limits with left ovarian corpus
luteal cyst. The right ovary measures 2 x 1.7 by 1.9 cm. The left
ovary measures 3.2 x 4.4 x 2.4 cm. Moderate anechoic free pelvic
fluid.
IMPRESSION: Probable early intrauterine gestational sac, but no yolk sac, fetal
pole, or cardiac activity yet visualized. Recommend follow-up
quantitative B-HCG levels and follow-up US in 14 days to assess
viability. This recommendation follows SRU consensus guidelines:
Diagnostic Criteria for Nonviable Pregnancy Early in the First
Trimester. N Engl J Med 6948; [DATE].

Moderate anechoic free fluid in the pelvis.

## 2018-05-05 IMAGING — US US OB TRANSVAGINAL
1 series · 15 of 28 positions shown · non-contrast
Comparison: 01/29/2016

CLINICAL DATA: Bleeding in the first trimester pregnancy

EXAM:
TRANSVAGINAL OB ULTRASOUND
TECHNIQUE: Transvaginal ultrasound was performed for complete evaluation of the
gestation as well as the maternal uterus, adnexal regions, and
pelvic cul-de-sac.

[Series 1: us ob transvaginal · 15 of 34 slices shown]
[im 1/34]
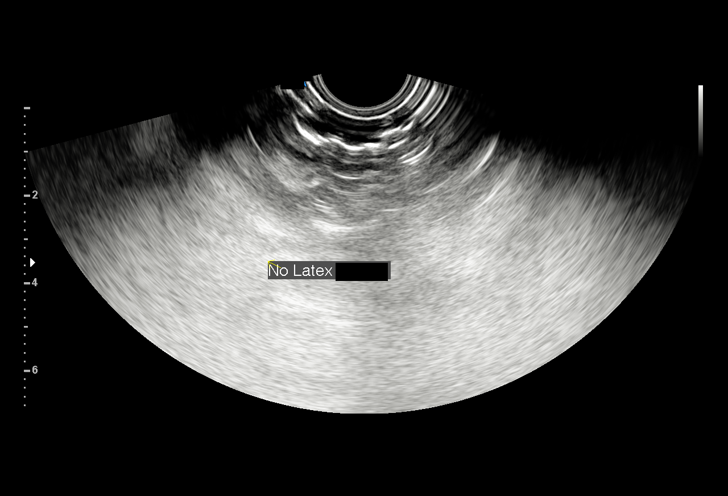
[im 3/34]
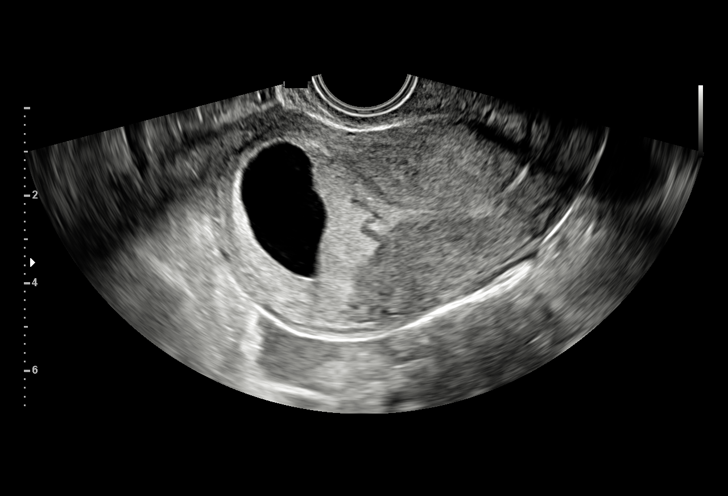
[im 5/34]
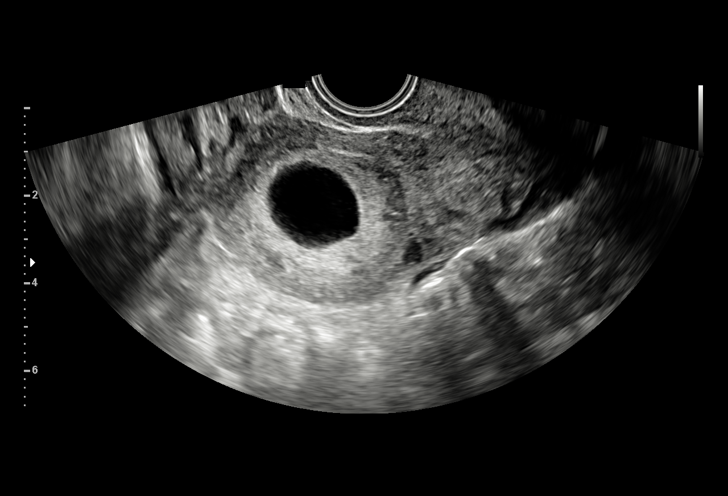
[im 8/34]
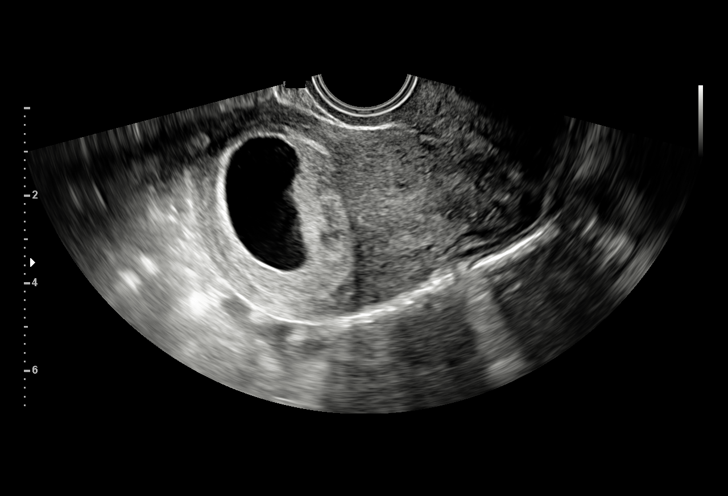
[im 10/34]
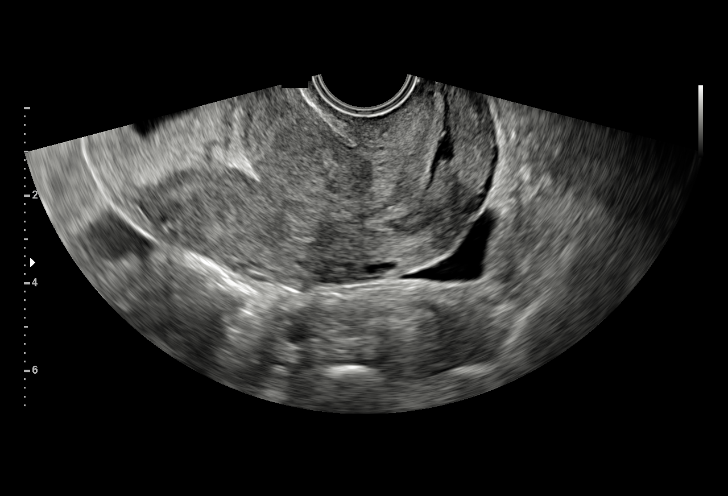
[im 13/34]
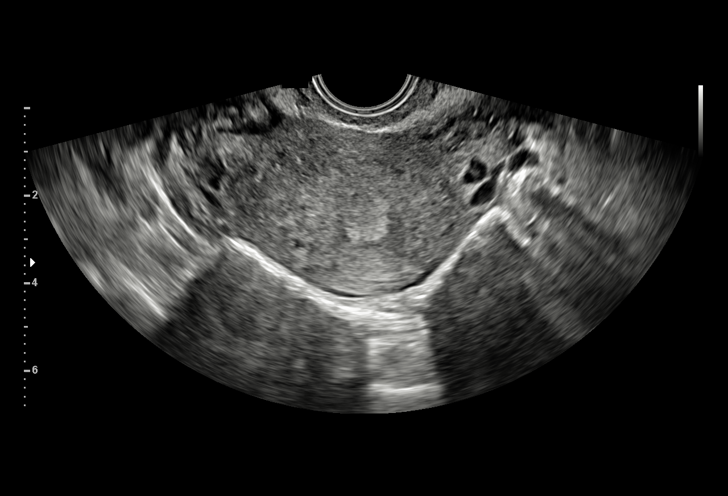
[im 15/34]
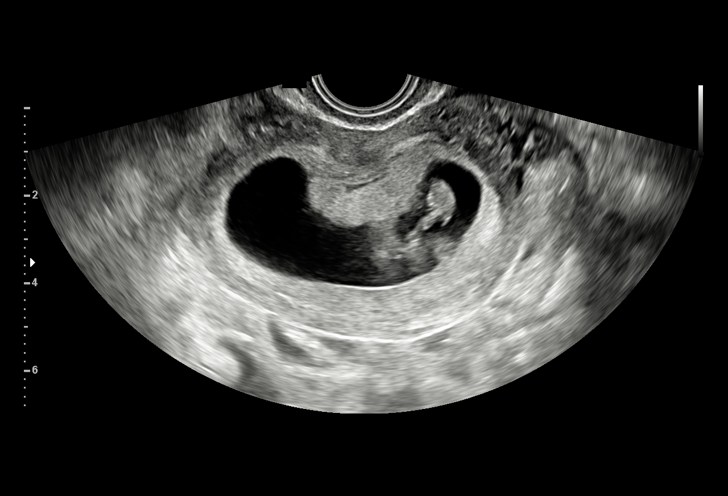
[im 18/34]
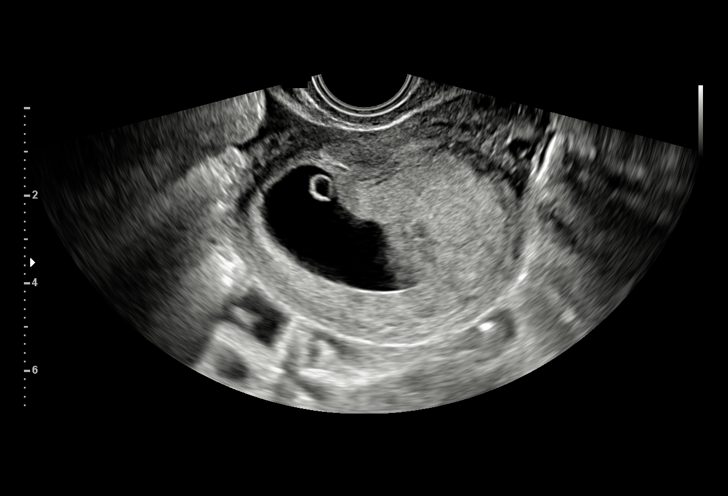
[im 19/34]
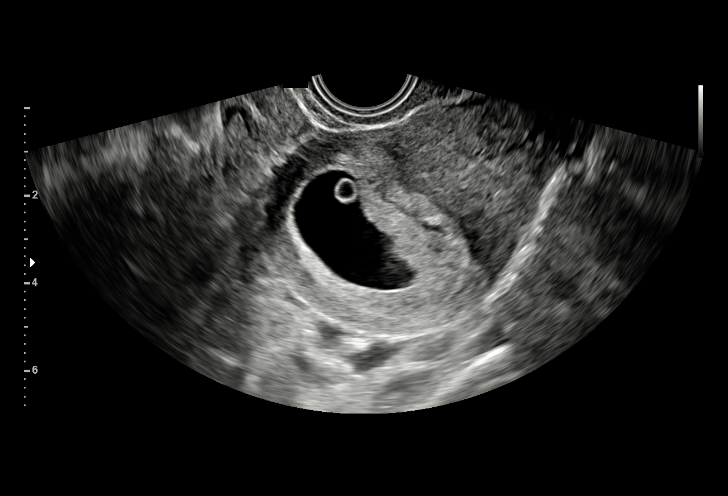
[im 21/34]
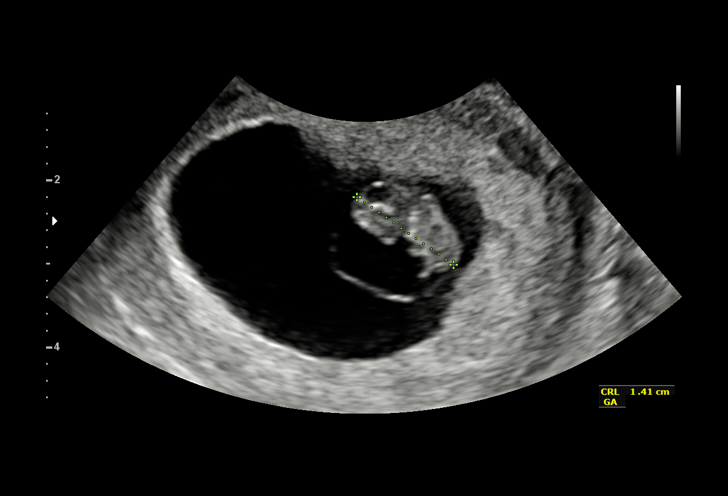
[im 24/34]
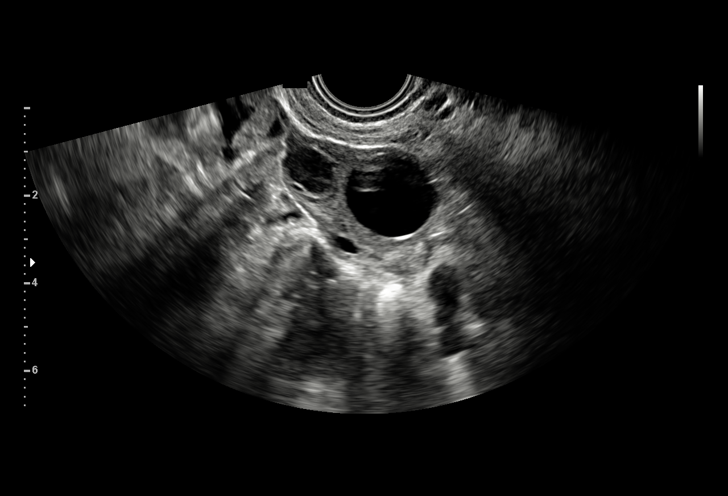
[im 26/34]
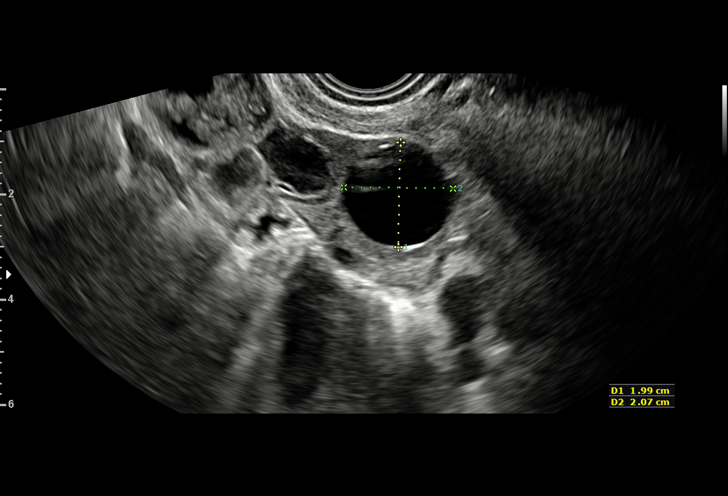
[im 29/34]
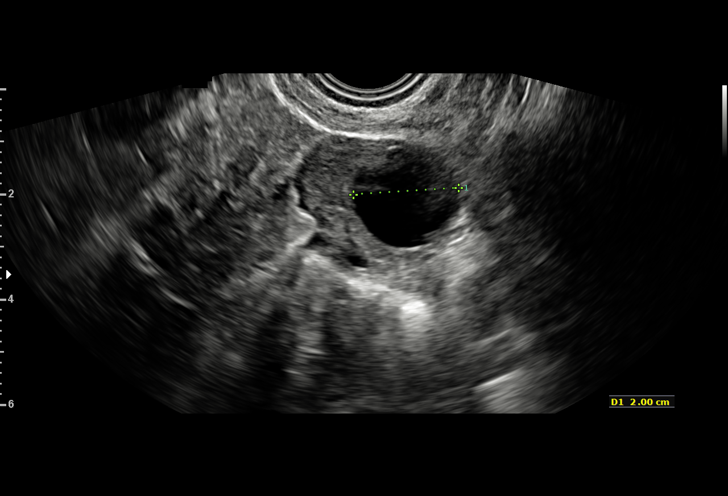
[im 31/34]
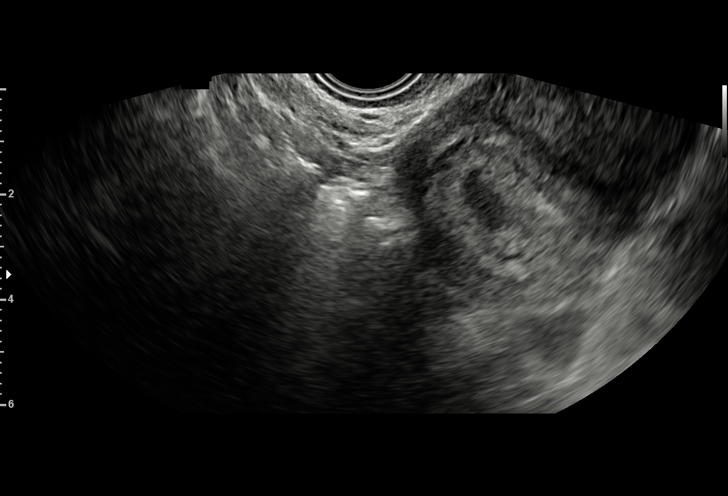
[im 34/34]
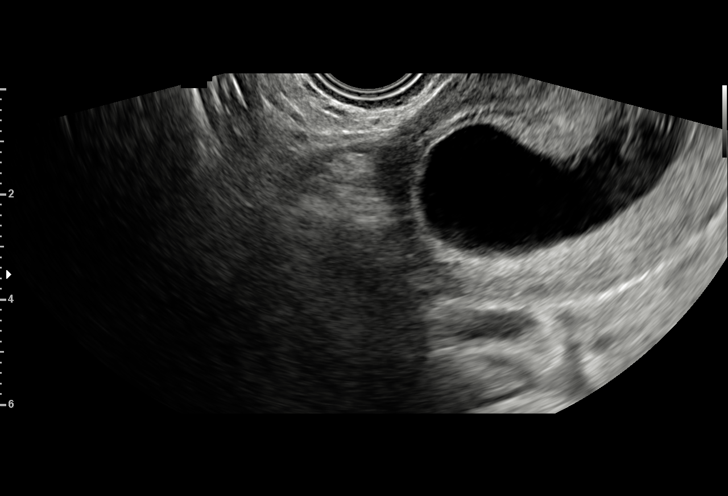

[15 of 28 positions shown; findings below may reference images not displayed]

FINDINGS: Intrauterine gestational sac: Single

Yolk sac:  Visualized.

Embryo:  Visualized.

Cardiac Activity: Visualized.

Heart Rate: 169 bpm

CRL:   13.8  mm   7 w 4 d                  US EDC: 10/01/2016

Subchorionic hemorrhage:  None visualized.

Maternal uterus/adnexae: Right ovary is unremarkable. Left ovary
contains a corpus luteum measuring 1.4 x 1.2 x 1.2 cm. An anechoic
simple cyst measuring 2.1 x 2 x 2 cm is also noted on the left.
IMPRESSION: Single live 7 week 4 day intrauterine gestation without
perigestational hematoma identified.

## 2018-06-20 DIAGNOSIS — E0500 Thyrotoxicosis with diffuse goiter without thyrotoxic crisis or storm: Secondary | ICD-10-CM | POA: Diagnosis not present

## 2018-07-06 DIAGNOSIS — N911 Secondary amenorrhea: Secondary | ICD-10-CM | POA: Diagnosis not present

## 2018-07-28 DIAGNOSIS — O26891 Other specified pregnancy related conditions, first trimester: Secondary | ICD-10-CM | POA: Diagnosis not present

## 2018-07-28 DIAGNOSIS — Z3A09 9 weeks gestation of pregnancy: Secondary | ICD-10-CM | POA: Diagnosis not present

## 2018-07-28 LAB — OB RESULTS CONSOLE GC/CHLAMYDIA
Chlamydia: NEGATIVE
Gonorrhea: NEGATIVE

## 2018-07-28 LAB — OB RESULTS CONSOLE RPR: RPR: NONREACTIVE

## 2018-07-28 LAB — OB RESULTS CONSOLE RUBELLA ANTIBODY, IGM: Rubella: IMMUNE

## 2018-07-28 LAB — OB RESULTS CONSOLE ABO/RH: RH Type: POSITIVE

## 2018-07-28 LAB — OB RESULTS CONSOLE HIV ANTIBODY (ROUTINE TESTING): HIV: NONREACTIVE

## 2018-07-28 LAB — OB RESULTS CONSOLE HEPATITIS B SURFACE ANTIGEN: Hepatitis B Surface Ag: NEGATIVE

## 2018-07-28 LAB — OB RESULTS CONSOLE ANTIBODY SCREEN: Antibody Screen: NEGATIVE

## 2019-02-08 ENCOUNTER — Telehealth (HOSPITAL_COMMUNITY): Payer: Self-pay | Admitting: *Deleted

## 2019-02-08 ENCOUNTER — Encounter (HOSPITAL_COMMUNITY): Payer: Self-pay | Admitting: *Deleted

## 2019-02-08 NOTE — Patient Instructions (Signed)
Skylin Tangney Berroa  02/08/2019   Your procedure is scheduled on:  02/22/2019  Arrive at [facility] at Entrance C on [address] at [facility]  and [employer]. You are invited to use the FREE valet parking or use the Visitor's parking deck.  Pick up the phone at the desk and dial [phone].  Call this number if you have problems the morning of surgery: [phone]  Remember:   Do not eat food:(After Midnight) Desps de medianoche.  Do not drink clear liquids: (After Midnight) Desps de medianoche.  Take these medicines the morning of surgery with A SIP OF WATER:  none   Do not wear jewelry, make-up or nail polish.  Do not wear lotions, powders, or perfumes. Do not wear deodorant.  Do not shave 48 hours prior to surgery.  Do not bring valuables to the hospital.  [facility] is not   responsible for any belongings or valuables brought to the hospital.  Contacts, dentures or bridgework may not be worn into surgery.  Leave suitcase in the car. After surgery it may be brought to your room.  For patients admitted to the hospital, checkout time is 11:00 AM the day of              discharge.      Please read over the following fact sheets that you were given:     Preparing for Surgery

## 2019-02-08 NOTE — Telephone Encounter (Signed)
Preadmission screen  

## 2019-02-10 ENCOUNTER — Telehealth (HOSPITAL_COMMUNITY): Payer: Self-pay | Admitting: *Deleted

## 2019-02-10 NOTE — Telephone Encounter (Signed)
Preadmission screen  

## 2019-02-14 ENCOUNTER — Telehealth (HOSPITAL_COMMUNITY): Payer: Self-pay | Admitting: *Deleted

## 2019-02-14 NOTE — Telephone Encounter (Signed)
Preadmission screen  

## 2019-02-15 ENCOUNTER — Telehealth (HOSPITAL_COMMUNITY): Payer: Self-pay | Admitting: *Deleted

## 2019-02-15 NOTE — Telephone Encounter (Signed)
Preadmission screen  

## 2019-02-16 ENCOUNTER — Telehealth (HOSPITAL_COMMUNITY): Payer: Self-pay | Admitting: *Deleted

## 2019-02-16 NOTE — Telephone Encounter (Signed)
Preadmission screen  

## 2019-02-17 ENCOUNTER — Telehealth (HOSPITAL_COMMUNITY): Payer: Self-pay | Admitting: *Deleted

## 2019-02-17 NOTE — Telephone Encounter (Signed)
Preadmission screen  

## 2019-02-20 ENCOUNTER — Other Ambulatory Visit (HOSPITAL_COMMUNITY)
Admission: RE | Admit: 2019-02-20 | Discharge: 2019-02-20 | Disposition: A | Discharge status: Home / Self Care | Payer: 59 | Source: Ambulatory Visit | Attending: Obstetrics & Gynecology | Admitting: Obstetrics & Gynecology

## 2019-02-20 ENCOUNTER — Other Ambulatory Visit: Payer: Self-pay

## 2019-02-20 LAB — TYPE AND SCREEN
ABO/RH(D): O POS
Antibody Screen: NEGATIVE

## 2019-02-20 LAB — SARS CORONAVIRUS 2 (TAT 6-24 HRS): SARS Coronavirus 2: NEGATIVE

## 2019-02-20 LAB — CBC
HCT: 37.2 % (ref 36.0–46.0)
Hemoglobin: 12.5 g/dL (ref 12.0–15.0)
MCH: 34.4 pg — ABNORMAL HIGH (ref 26.0–34.0)
MCHC: 33.6 g/dL (ref 30.0–36.0)
MCV: 102.5 fL — ABNORMAL HIGH (ref 80.0–100.0)
Platelets: 201 10*3/uL (ref 150–400)
RBC: 3.63 MIL/uL — ABNORMAL LOW (ref 3.87–5.11)
RDW: 12.3 % (ref 11.5–15.5)
WBC: 8.5 10*3/uL (ref 4.0–10.5)
nRBC: 0 % (ref 0.0–0.2)

## 2019-02-20 LAB — RPR: RPR Ser Ql: NONREACTIVE

## 2019-02-20 LAB — ABO/RH: ABO/RH(D): O POS

## 2019-02-20 NOTE — MAU Note (Signed)
Pt denied any symptoms and tolerated swab well.

## 2019-02-21 ENCOUNTER — Encounter (HOSPITAL_COMMUNITY): Payer: Self-pay | Admitting: Obstetrics and Gynecology

## 2019-02-21 ENCOUNTER — Inpatient Hospital Stay (HOSPITAL_COMMUNITY)
Admission: AD | Admit: 2019-02-21 | Discharge: 2019-02-22 | DRG: 807 | Disposition: A | Discharge status: Home / Self Care | Payer: 59 | Attending: Obstetrics and Gynecology | Admitting: Obstetrics and Gynecology

## 2019-02-21 ENCOUNTER — Inpatient Hospital Stay (HOSPITAL_COMMUNITY): Payer: 59 | Admitting: Anesthesiology

## 2019-02-21 ENCOUNTER — Inpatient Hospital Stay (HOSPITAL_COMMUNITY)
Admission: RE | Admit: 2019-02-21 | Discharge: 2019-02-21 | Disposition: A | Discharge status: Home / Self Care | Payer: 59 | Source: Ambulatory Visit | Attending: Obstetrics and Gynecology | Admitting: Obstetrics and Gynecology

## 2019-02-21 DIAGNOSIS — O9952 Diseases of the respiratory system complicating childbirth: Secondary | ICD-10-CM | POA: Diagnosis present

## 2019-02-21 DIAGNOSIS — O26893 Other specified pregnancy related conditions, third trimester: Secondary | ICD-10-CM | POA: Diagnosis present

## 2019-02-21 DIAGNOSIS — O34219 Maternal care for unspecified type scar from previous cesarean delivery: Secondary | ICD-10-CM | POA: Diagnosis present

## 2019-02-21 DIAGNOSIS — Z8632 Personal history of gestational diabetes: Secondary | ICD-10-CM

## 2019-02-21 DIAGNOSIS — O403XX0 Polyhydramnios, third trimester, not applicable or unspecified: Secondary | ICD-10-CM | POA: Diagnosis present

## 2019-02-21 DIAGNOSIS — J45909 Unspecified asthma, uncomplicated: Secondary | ICD-10-CM | POA: Diagnosis present

## 2019-02-21 DIAGNOSIS — Z3A39 39 weeks gestation of pregnancy: Secondary | ICD-10-CM

## 2019-02-21 LAB — CBC
HCT: 37.6 % (ref 36.0–46.0)
Hemoglobin: 12.9 g/dL (ref 12.0–15.0)
MCH: 34.7 pg — ABNORMAL HIGH (ref 26.0–34.0)
MCHC: 34.3 g/dL (ref 30.0–36.0)
MCV: 101.1 fL — ABNORMAL HIGH (ref 80.0–100.0)
Platelets: 201 10*3/uL (ref 150–400)
RBC: 3.72 MIL/uL — ABNORMAL LOW (ref 3.87–5.11)
RDW: 12.3 % (ref 11.5–15.5)
WBC: 9.9 10*3/uL (ref 4.0–10.5)
nRBC: 0 % (ref 0.0–0.2)

## 2019-02-21 LAB — COMPREHENSIVE METABOLIC PANEL
ALT: 17 U/L (ref 0–44)
AST: 21 U/L (ref 15–41)
Albumin: 2.9 g/dL — ABNORMAL LOW (ref 3.5–5.0)
Alkaline Phosphatase: 166 U/L — ABNORMAL HIGH (ref 38–126)
Anion gap: 8 (ref 5–15)
BUN: 6 mg/dL (ref 6–20)
CO2: 22 mmol/L (ref 22–32)
Calcium: 8.4 mg/dL — ABNORMAL LOW (ref 8.9–10.3)
Chloride: 106 mmol/L (ref 98–111)
Creatinine, Ser: 0.59 mg/dL (ref 0.44–1.00)
GFR calc Af Amer: >60 mL/min (ref >60)
GFR calc non Af Amer: >60 mL/min (ref >60)
Glucose, Bld: 88 mg/dL (ref 70–99)
Potassium: 3.8 mmol/L (ref 3.5–5.1)
Sodium: 136 mmol/L (ref 135–145)
Total Bilirubin: 0.5 mg/dL (ref 0.3–1.2)
Total Protein: 6 g/dL — ABNORMAL LOW (ref 6.5–8.1)

## 2019-02-21 LAB — RPR: RPR Ser Ql: NONREACTIVE

## 2019-02-21 MED ORDER — ONDANSETRON HCL 4 MG PO TABS: 4.0000 mg | ORAL_TABLET | ORAL | Status: DC | PRN

## 2019-02-21 MED ORDER — LACTATED RINGERS IV SOLN: 500.0000 mL | Freq: Once | INTRAVENOUS | Status: DC

## 2019-02-21 MED ORDER — EPHEDRINE 5 MG/ML INJ: 10.0000 mg | INTRAVENOUS | Status: DC | PRN

## 2019-02-21 MED ORDER — WITCH HAZEL-GLYCERIN EX PADS: 1.0000 "application " | MEDICATED_PAD | CUTANEOUS | Status: DC | PRN

## 2019-02-21 MED ORDER — PHENYLEPHRINE 40 MCG/ML (10ML) SYRINGE FOR IV PUSH (FOR BLOOD PRESSURE SUPPORT): 80.0000 ug | PREFILLED_SYRINGE | INTRAVENOUS | Status: DC | PRN

## 2019-02-21 MED ORDER — FENTANYL-BUPIVACAINE-NACL 0.5-0.125-0.9 MG/250ML-% EP SOLN
12.0000 mL/h | EPIDURAL | Status: DC | PRN
  Filled 2019-02-21: qty 250

## 2019-02-21 MED ORDER — COCONUT OIL OIL: 1.0000 "application " | TOPICAL_OIL | Status: DC | PRN

## 2019-02-21 MED ORDER — ZOLPIDEM TARTRATE 5 MG PO TABS: 5.0000 mg | ORAL_TABLET | Freq: Every evening | ORAL | Status: DC | PRN

## 2019-02-21 MED ORDER — DIPHENHYDRAMINE HCL 25 MG PO CAPS: 25.0000 mg | ORAL_CAPSULE | Freq: Four times a day (QID) | ORAL | Status: DC | PRN

## 2019-02-21 MED ORDER — OXYTOCIN BOLUS FROM INFUSION
500.0000 mL | Freq: Once | INTRAVENOUS | Status: AC
  Administered 2019-02-21: 500 mL via INTRAVENOUS

## 2019-02-21 MED ORDER — LIDOCAINE HCL (PF) 1 % IJ SOLN
30.0000 mL | INTRAMUSCULAR | Status: DC | PRN
  Filled 2019-02-21: qty 30

## 2019-02-21 MED ORDER — SODIUM CHLORIDE (PF) 0.9 % IJ SOLN
INTRAMUSCULAR | Status: DC | PRN
  Administered 2019-02-21: 12 mL/h via EPIDURAL

## 2019-02-21 MED ORDER — SENNOSIDES-DOCUSATE SODIUM 8.6-50 MG PO TABS
2.0000 | ORAL_TABLET | ORAL | Status: DC
  Administered 2019-02-21: 2 via ORAL
  Filled 2019-02-21: qty 2

## 2019-02-21 MED ORDER — LIDOCAINE HCL (PF) 1 % IJ SOLN
INTRAMUSCULAR | Status: DC | PRN
  Administered 2019-02-21 (×2): 4 mL via EPIDURAL

## 2019-02-21 MED ORDER — OXYTOCIN 40 UNITS IN NORMAL SALINE INFUSION - SIMPLE MED
1.0000 m[IU]/min | INTRAVENOUS | Status: DC
  Administered 2019-02-21: 2 m[IU]/min via INTRAVENOUS
  Filled 2019-02-21: qty 1000

## 2019-02-21 MED ORDER — ACETAMINOPHEN 325 MG PO TABS: 650.0000 mg | ORAL_TABLET | ORAL | Status: DC | PRN

## 2019-02-21 MED ORDER — BENZOCAINE-MENTHOL 20-0.5 % EX AERO
1.0000 "application " | INHALATION_SPRAY | CUTANEOUS | Status: DC | PRN
  Administered 2019-02-21: 1 via TOPICAL
  Filled 2019-02-21: qty 56

## 2019-02-21 MED ORDER — LACTATED RINGERS IV SOLN: 500.0000 mL | INTRAVENOUS | Status: DC | PRN

## 2019-02-21 MED ORDER — SIMETHICONE 80 MG PO CHEW: 80.0000 mg | CHEWABLE_TABLET | ORAL | Status: DC | PRN

## 2019-02-21 MED ORDER — IBUPROFEN 600 MG PO TABS
600.0000 mg | ORAL_TABLET | Freq: Four times a day (QID) | ORAL | Status: DC
  Administered 2019-02-21 – 2019-02-22 (×5): 600 mg via ORAL
  Filled 2019-02-21 (×5): qty 1

## 2019-02-21 MED ORDER — DIPHENHYDRAMINE HCL 50 MG/ML IJ SOLN: 12.5000 mg | INTRAMUSCULAR | Status: DC | PRN

## 2019-02-21 MED ORDER — SOD CITRATE-CITRIC ACID 500-334 MG/5ML PO SOLN: 30.0000 mL | ORAL | Status: DC | PRN

## 2019-02-21 MED ORDER — OXYCODONE-ACETAMINOPHEN 5-325 MG PO TABS: 2.0000 | ORAL_TABLET | ORAL | Status: DC | PRN

## 2019-02-21 MED ORDER — ACETAMINOPHEN 325 MG PO TABS
650.0000 mg | ORAL_TABLET | ORAL | Status: DC | PRN
  Administered 2019-02-22: 650 mg via ORAL
  Filled 2019-02-21: qty 2

## 2019-02-21 MED ORDER — PRENATAL MULTIVITAMIN CH
1.0000 | ORAL_TABLET | Freq: Every day | ORAL | Status: DC
  Administered 2019-02-22: 1 via ORAL
  Filled 2019-02-21: qty 1

## 2019-02-21 MED ORDER — OXYTOCIN 40 UNITS IN NORMAL SALINE INFUSION - SIMPLE MED
2.5000 [IU]/h | INTRAVENOUS | Status: DC
  Administered 2019-02-21: 2.5 [IU]/h via INTRAVENOUS

## 2019-02-21 MED ORDER — ONDANSETRON HCL 4 MG/2ML IJ SOLN: 4.0000 mg | Freq: Four times a day (QID) | INTRAMUSCULAR | Status: DC | PRN

## 2019-02-21 MED ORDER — DIBUCAINE (PERIANAL) 1 % EX OINT: 1.0000 "application " | TOPICAL_OINTMENT | CUTANEOUS | Status: DC | PRN

## 2019-02-21 MED ORDER — TETANUS-DIPHTH-ACELL PERTUSSIS 5-2.5-18.5 LF-MCG/0.5 IM SUSP: 0.5000 mL | Freq: Once | INTRAMUSCULAR | Status: DC

## 2019-02-21 MED ORDER — PHENYLEPHRINE 40 MCG/ML (10ML) SYRINGE FOR IV PUSH (FOR BLOOD PRESSURE SUPPORT)
80.0000 ug | PREFILLED_SYRINGE | INTRAVENOUS | Status: DC | PRN
  Filled 2019-02-21: qty 10

## 2019-02-21 MED ORDER — TERBUTALINE SULFATE 1 MG/ML IJ SOLN: 0.2500 mg | Freq: Once | INTRAMUSCULAR | Status: DC | PRN

## 2019-02-21 MED ORDER — OXYCODONE-ACETAMINOPHEN 5-325 MG PO TABS: 1.0000 | ORAL_TABLET | ORAL | Status: DC | PRN

## 2019-02-21 MED ORDER — ALBUTEROL SULFATE (2.5 MG/3ML) 0.083% IN NEBU: 3.0000 mL | INHALATION_SOLUTION | Freq: Four times a day (QID) | RESPIRATORY_TRACT | Status: DC | PRN

## 2019-02-21 MED ORDER — ONDANSETRON HCL 4 MG/2ML IJ SOLN: 4.0000 mg | INTRAMUSCULAR | Status: DC | PRN

## 2019-02-21 MED ORDER — LACTATED RINGERS IV SOLN: INTRAVENOUS | Status: DC

## 2019-02-21 MED ORDER — OXYTOCIN 40 UNITS IN NORMAL SALINE INFUSION - SIMPLE MED: 1.0000 m[IU]/min | INTRAVENOUS | Status: DC

## 2019-02-21 MED ORDER — FENTANYL CITRATE (PF) 100 MCG/2ML IJ SOLN: 50.0000 ug | INTRAMUSCULAR | Status: DC | PRN

## 2019-02-21 NOTE — H&P (Addendum)
[name] [name] is a 33 y.o. female G3P1101 at 28 0/7 weeks (  EDD 02/28/19  by LMP c/w 9 week [place])  presenting for TOLAC with favorable cervix. Prenatal care significant for a prior IUFD at 57 weeks of unknown etiology (G1) followed by a subsequent delivery at term by [facility] for malpresentation.  She had a h/o gestational hypertension and has taken baby ASA this pregnancy as well as a h/o GDM with G2 but tested negative x 2 this pregnancy.   There is known mild polyhydramnios and she had a low lying placenta that resolved on 30 week [place].  She also has a h/o asthma that is stable and a h/o hyperthyroidism but has been followed by labs and stable on no medications.  OB History    Gravida  3   Para  2   Term  1   Preterm  1   AB      Living  1     SAB      TAB      Ectopic      Multiple  0   Live Births  1         08-19-2014, 36.3 wks 1. F, 5lbs 5oz 09-13-2016, 37.2 wks 1. M, 6lbs 11oz, Cesarean Section  Past Medical History:  Diagnosis Date  . Anxiety   . Asthma   . Depression 2016   "after daughter was stillborn" (08/24/2017)  . Gestational diabetes 2018  . Graves disease   . Hypertension 2016   "during pregnancy only" (08/24/2017)  . Postpartum depression 2018  . Psoriatic arthritis [facility])    Past Surgical History:  Procedure Laterality Date  . APPENDECTOMY  08/24/2017  . CESAREAN SECTION N/A 09/13/2016   Procedure: CESAREAN SECTION;  Surgeon: Janyth Contes, MD;  Location: [place];  Service: Obstetrics;  Laterality: N/A;  . LAPAROSCOPIC APPENDECTOMY N/A 08/24/2017   Procedure: APPENDECTOMY LAPAROSCOPIC;  Surgeon: Erroll Luna, MD;  Location: [place];  Service: General;  Laterality: N/A;  . WISDOM TOOTH EXTRACTION  2004   Family History: family history includes Cancer in her maternal grandmother; Heart disease in her maternal grandmother. Social History:  reports that she has never smoked. She has never used smokeless tobacco. She reports current  alcohol use of about 3.0 standard drinks of alcohol per week. She reports that she does not use drugs.     Maternal Diabetes: No Genetic Screening: Normal Maternal Ultrasounds/Referrals: Normal and Other:mild polyhydramnios Fetal Ultrasounds or other Referrals:  None Maternal Substance Abuse:  No Significant Maternal Medications:  Meds include: Other: Albuterol, Baby ASA Significant Maternal Lab Results:  None Other Comments:  None  Review of Systems  Constitutional: Negative for fever.  Respiratory: Negative for shortness of breath.   Neurological: Negative for headaches.   Maternal Medical History:  Contractions: Frequency: rare and irregular.   Perceived severity is mild.    Fetal activity: Perceived fetal activity is normal.    Prenatal complications: H/o LTCS,  h/o IUFD at 63 weeks of unknown etiology H/o gestational hypertension, polyhydramnios   Prenatal Complications - Diabetes: none.      Last menstrual period 05/24/2018, currently breastfeeding. Maternal Exam:  Uterine Assessment: Contraction strength is mild.  Contraction frequency is irregular.   Abdomen: Patient reports no abdominal tenderness. Surgical scars: low transverse.   Fetal presentation: vertex  Introitus: Normal vulva. Normal vagina.    Physical Exam  Constitutional: She appears well-developed.  Cardiovascular: Normal rate and regular rhythm.  Respiratory: Effort normal.  GI: Soft.  Genitourinary:    Vulva and vagina normal.   Musculoskeletal:        General: Normal range of motion.  Neurological: She is alert.  Psychiatric: She has a normal mood and affect.    Prenatal labs: ABO, Rh: --/--/O POS, O POS Performed at [facility], [place] [address] [place], [place] [address]  [phone]) Antibody: NEG (12/14 0915) Rubella: Immune (05/21 0000) RPR: NON REACTIVE (12/14 0912)  HBsAg: Negative (05/21 0000)  HIV: Non-reactive (05/21 0000)  GBS:   Negative Panorama low risk AFP  negative Glucola x 2 WNL 109, 123 Essential panel negative 2018  Assessment/Plan: Pt admitted for TOLAC after counseling and review of the VBAC consent form in our office.  She understands a less than 1% risk of uterine rupture and that if any fetal distress noted we would proceed with repeat c-section immediately.   We will plan pitocin and AROM when fetal station low.  Logan Bores 02/21/2019, 6:09 AM

## 2019-02-21 NOTE — Anesthesia Preprocedure Evaluation (Signed)
Anesthesia Evaluation  Patient identified by MRN, date of birth, ID band Patient awake    Reviewed: Allergy & Precautions, Patient's Chart, lab work & pertinent test results  Airway Mallampati: II  TM Distance: >3 FB Neck ROM: Full    Dental no notable dental hx. (+) Teeth Intact   Pulmonary asthma ,    Pulmonary exam normal breath sounds clear to auscultation       Cardiovascular hypertension, Normal cardiovascular exam Rhythm:Regular Rate:Normal     Neuro/Psych PSYCHIATRIC DISORDERS Anxiety Depression negative neurological ROS     GI/Hepatic negative GI ROS,   Endo/Other  diabetes, Well ControlledHyperthyroidism Hx/o Grave's disease  Renal/GU negative Renal ROS  negative genitourinary   Musculoskeletal  (+) Arthritis , Hx/o Psoriatic arthritis   Abdominal   Peds  Hematology negative hematology ROS (+)   Anesthesia Other Findings   Reproductive/Obstetrics                             Anesthesia Physical Anesthesia Plan  ASA: II  Anesthesia Plan: Epidural   Post-op Pain Management:    Induction:   PONV Risk Score and Plan:   Airway Management Planned: Natural Airway  Additional Equipment:   Intra-op Plan:   Post-operative Plan:   Informed Consent: I have reviewed the patients History and Physical, chart, labs and discussed the procedure including the risks, benefits and alternatives for the proposed anesthesia with the patient or authorized representative who has indicated his/her understanding and acceptance.       Plan Discussed with: Anesthesiologist  Anesthesia Plan Comments:         Anesthesia Quick Evaluation

## 2019-02-21 NOTE — Progress Notes (Signed)
Patient ID: [name] [name], female   DOB: 12-Mar-1985, 33 y.o.   MRN: [id number] Comfortable with epidural  afeb vss FHR category 1  AROM and IUPC placed to adjust pitocin  Cervix 80/4+/-1  Follow progress

## 2019-02-21 NOTE — H&P (Deleted)
  The note originally documented on this encounter has been moved the the encounter in which it belongs.  

## 2019-02-21 NOTE — Progress Notes (Signed)
Patient ID: [name] [name], female   DOB: 03-13-1985, 33 y.o.   MRN: [id number] Pt admitted and had cramping overnight.  Feeling mild contractions now every 20 minutes.  Some scant VB from cervical check  BP WNL afeb FHR category 1  Cervix 3-4/80/-1  Will augment with pitocin and get epidural AROM after epidural placed and do IUPC to adjust pitocin given VBAC

## 2019-02-21 NOTE — Lactation Note (Signed)
This note was copied from a baby's chart. Lactation Consultation Note  Patient Name: [name] [name] Today's Date: 02/21/2019 Reason for consult: Initial assessment;Term P2, 6 hour term female infant. Per mom, she has two DEBP's at home , medela and motiff. Per mom this is her 3rd child her first child was still born at birth. Mom is experienced at breastfeeding she breastfed her 33 year old for 15 months. Infant had one void and one stool. Per mom infant breastfeed for 10 minutes on each side at 1412 pm , 15 minutes at 47 pm and this is her 3rd time latching infant at breast. Per mom, infant been latching well but doesn't latch as well on her left side. Mom latched infant on left breast using the cross cradle hold, infant open mouth wide, nose and chin touching breast and swallows observed. Infant sustained latch for 15 minutes, mom burped infant, infant was still cuing and mom latched infant on right breast using cross cradle hold at 5 minutes infant was still breastfeeding when [facility] left the room. [place] notice mom has slightly inverted nipples but she doesn't need shells or NS infant is latching well at breast.  Mom knows to breastfeed according to hunger cues, 8 to 12 times within 24 hours and on demand. Parents will continue to do STS as much as possible. Mom know to call RN or LC if she has any questions, concerns or need assistance with latching infant at breast. Reviewed Baby & Me book's Breastfeeding Basics.  Mom made aware of O/P services, breastfeeding support groups, community resources, and our phone # for post-discharge questions.   Maternal Data Formula Feeding for Exclusion: No Has patient been taught Hand Expression?: Yes Does the patient have breastfeeding experience prior to this delivery?: Yes  Feeding Feeding Type: Breast Fed  LATCH Score Latch: Grasps breast easily, tongue down, lips flanged, rhythmical sucking.  Audible Swallowing: Spontaneous and  intermittent  Type of Nipple: Everted at rest and after stimulation  Comfort (Breast/Nipple): Soft / non-tender  Hold (Positioning): Assistance needed to correctly position infant at breast and maintain latch.  LATCH Score: 9  Interventions Interventions: Breast feeding basics reviewed;Breast compression;Assisted with latch;Adjust position;Skin to skin;Support pillows;Breast massage;Position options;Hand express;Expressed milk  Lactation Tools Discussed/Used WIC Program: No   Consult Status Consult Status: Follow-up Date: 02/22/19 Follow-up type: In-patient    Vicente Serene 02/21/2019, 8:43 PM

## 2019-02-21 NOTE — Anesthesia Procedure Notes (Signed)
Epidural Patient location during procedure: OB Start time: 02/21/2019 9:15 AM End time: 02/21/2019 9:22 AM  Staffing Anesthesiologist: Josephine Igo, MD Performed: anesthesiologist   Preanesthetic Checklist Completed: patient identified, IV checked, site marked, risks and benefits discussed, surgical consent, monitors and equipment checked, pre-op evaluation and timeout performed  Epidural Patient position: sitting Prep: DuraPrep and site prepped and draped Patient monitoring: continuous pulse ox and blood pressure Approach: midline Location: L3-L4 Injection technique: LOR air  Needle:  Needle type: Tuohy  Needle gauge: 17 G Needle length: 9 cm and 9 Needle insertion depth: 4 cm Catheter type: closed end flexible Catheter size: 19 Gauge Catheter at skin depth: 9 cm Test dose: negative and Other  Assessment Events: blood not aspirated, injection not painful, no injection resistance, no paresthesia and negative IV test  Additional Notes Patient identified. Risks and benefits discussed including failed block, incomplete  Pain control, post dural puncture headache, nerve damage, paralysis, blood pressure Changes, nausea, vomiting, reactions to medications-both toxic and allergic and post Partum back pain. All questions were answered. Patient expressed understanding and wished to proceed. Sterile technique was used throughout procedure. Epidural site was Dressed with sterile barrier dressing. No paresthesias, signs of intravascular injection Or signs of intrathecal spread were encountered.  Patient was more comfortable after the epidural was dosed. Please see RN's note for documentation of vital signs and FHR which are stable. Reason for block:procedure for pain

## 2019-02-22 ENCOUNTER — Inpatient Hospital Stay (HOSPITAL_COMMUNITY): Admission: RE | Admit: 2019-02-22 | Payer: 59 | Source: Home / Self Care | Admitting: Obstetrics and Gynecology

## 2019-02-22 ENCOUNTER — Encounter (HOSPITAL_COMMUNITY): Admission: RE | Payer: Self-pay | Source: Home / Self Care

## 2019-02-22 LAB — CBC
HCT: 33.4 % — ABNORMAL LOW (ref 36.0–46.0)
Hemoglobin: 11.2 g/dL — ABNORMAL LOW (ref 12.0–15.0)
MCH: 34 pg (ref 26.0–34.0)
MCHC: 33.5 g/dL (ref 30.0–36.0)
MCV: 101.5 fL — ABNORMAL HIGH (ref 80.0–100.0)
Platelets: 172 10*3/uL (ref 150–400)
RBC: 3.29 MIL/uL — ABNORMAL LOW (ref 3.87–5.11)
RDW: 12.4 % (ref 11.5–15.5)
WBC: 10.6 10*3/uL — ABNORMAL HIGH (ref 4.0–10.5)
nRBC: 0 % (ref 0.0–0.2)

## 2019-02-22 SURGERY — Surgical Case
Anesthesia: Spinal

## 2019-02-22 MED ORDER — IBUPROFEN 600 MG PO TABS: 600.0000 mg | ORAL_TABLET | Freq: Four times a day (QID) | ORAL | 1 refills | Status: DC | PRN

## 2019-02-22 NOTE — Discharge Instructions (Signed)
Call office with any concerns [phone] 

## 2019-02-22 NOTE — Progress Notes (Signed)
CSW received consult for history of depression, anxiety and PPD.  CSW met with [name] to offer support and complete assessment.    [name] sitting up in bed with FOB present at bedside and infant resting next to him, when CSW entered the room. [name] and FOB both welcoming of CSW visit and were pleasant and engaged throughout assessment. CSW introduced [place] and explained reason for consult to which [name] expressed understanding. CSW inquired about [name]'s mental health history and [name] acknowledged a history of anxiety, depression and panic attacks. [name] further shared that much of her depression was around the loss of her first child and that she had some PPA following the birth of her son. [name] reported symptoms were at a peak for the first month but began to improve after that. [name] stated she saw a therapist once and was on medications for a short time. [name] shared her pregnancy and her delivery were a much better experience than previous pregnancy and delivery and that she's currently doing good. [name] stated she feels comfortable reaching out to her doctor if she notices symptoms start to arise. CSW provided education regarding the baby blues period vs. perinatal mood disorders. CSW recommended [place]-evaluation during the postpartum time period using the New Mom Checklist from Postpartum Progress and encouraged [name] to contact a medical professional if symptoms are noted at any time. [name] did not appear to be displaying any acute mental health symptoms and denied any current SI or HI. [name] reported having good support from FOB and his parents.  [name] confirmed having all essential items for infant once discharged and reported infant would be sleeping in a bassinet once home. [name] aware of SIDS precautions and safe sleeping habits and was able to review them for CSW.   CSW identifies no further need for intervention and no barriers to discharge at this time.  [name] Miles, LCSW [facility] [employer] [phone]

## 2019-02-22 NOTE — Progress Notes (Signed)
Thyroglobulin antibody has been collected (with CBC) this am.  Results are pending and in process per Chart Review (under Labs).

## 2019-02-22 NOTE — Discharge Summary (Signed)
OB Discharge Summary     Patient Name: [name] [name] DOB: 1985/07/15 MRN: [id number]  Date of admission: 02/21/2019 Delivering MD: Paula Compton   Date of discharge: 02/22/2019  Admitting diagnosis: Indication for care in labor and delivery, antepartum [O75.9] VBAC, delivered [O34.219] Intrauterine pregnancy: [redacted]w[redacted]d     Secondary diagnosis:  Active Problems:   Indication for care in labor and delivery, antepartum   VBAC, delivered  Additional problems: none     Discharge diagnosis: Term Pregnancy Delivered                                                                                                Post partum procedures:none  Augmentation: AROM and Pitocin  Complications: None  Hospital course:  Induction of Labor With Vaginal Delivery   33 y.o. yo [id number] at [redacted]w[redacted]d was admitted to the hospital 02/21/2019 for induction of labor.  Indication for induction: Favorable cervix at term.  Patient had an uncomplicated labor course as follows: Membrane Rupture Time/Date: 10:11 AM ,02/21/2019   Intrapartum Procedures: Episiotomy: None [1]                                         Lacerations:    Patient had delivery of a Viable infant.  Information for the patient's newborn:  Degroff, Boy Sahasra [id number]  Delivery Method: Vag-Spont    02/21/2019  Details of delivery can be found in separate delivery note.  Patient had a routine postpartum course. Patient is discharged home 02/22/19.  Physical exam  Vitals:   02/21/19 1645 02/21/19 2045 02/22/19 0100 02/22/19 0500  BP: (!) 91/51 110/69 (!) 95/53 (!) 91/57  Pulse: 85 93 74 73  Resp: 16 18 18 17   Temp: 98.5 F (36.9 C) 97.8 F (36.6 C) (!) 97.5 F (36.4 C) 97.6 F (36.4 C)  TempSrc: Oral Oral Oral Oral  SpO2: 97% 100% 98% 100%  Weight:      Height:       General: alert, cooperative and no distress Lochia: appropriate Uterine Fundus: firm Incision: N/A DVT Evaluation: No evidence of DVT seen on physical  exam. Labs: Lab Results  Component Value Date   WBC 10.6 (H) 02/22/2019   HGB 11.2 (L) 02/22/2019   HCT 33.4 (L) 02/22/2019   MCV 101.5 (H) 02/22/2019   PLT 172 02/22/2019   CMP Latest Ref Rng & Units 02/21/2019  Glucose 70 - 99 mg/dL 88  BUN 6 - 20 mg/dL 6  Creatinine 0.44 - 1.00 mg/dL 0.59  Sodium 135 - 145 mmol/L 136  Potassium 3.5 - 5.1 mmol/L 3.8  Chloride 98 - 111 mmol/L 106  CO2 22 - 32 mmol/L 22  Calcium 8.9 - 10.3 mg/dL 8.4(L)  Total Protein 6.5 - 8.1 g/dL 6.0(L)  Total Bilirubin 0.3 - 1.2 mg/dL 0.5  Alkaline Phos 38 - 126 U/L 166(H)  AST 15 - 41 U/L 21  ALT 0 - 44 U/L 17    Discharge instruction: per After Visit Summary and "Baby and  Me Booklet".  After visit meds:  Allergies as of 02/22/2019      Reactions   Cats Claw [uncaria Tomentosa (cats Claw)] Shortness Of Breath, Itching, Rash, Other (See Comments)   Sneezing/wheezing (asthma induced attack)   Mold Extract [trichophyton] Other (See Comments)   Per allergy test      Medication List    TAKE these medications   albuterol 108 (90 Base) MCG/ACT inhaler Commonly known as: VENTOLIN HFA Inhale 1 puff into the lungs every 6 (six) hours as needed for wheezing or shortness of breath.   aspirin EC 81 MG tablet Take 81 mg by mouth every evening.   ibuprofen 600 MG tablet Commonly known as: ADVIL Take 1 tablet (600 mg total) by mouth every 6 (six) hours as needed for cramping.   Prenate Mini 18-0.6-0.4-350 MG Caps Take 1 capsule by mouth every evening.       Diet: routine diet  Activity: Advance as tolerated. Pelvic rest for 6 weeks.   Outpatient follow up:6 weeks Follow up Appt:No future appointments. Follow up Visit:No follow-ups on file.  Postpartum contraception: Not Discussed  Newborn Data: Live born female  Birth Weight: 8 lb 4.6 oz (3759 g) APGAR: 9, 9  Newborn Delivery   Birth date/time: 02/21/2019 13:58:00 Delivery type: VBAC, Spontaneous      Baby Feeding:  Breast Disposition:home with mother   02/22/2019 Isaiah Serge, DO

## 2019-02-22 NOTE — Progress Notes (Signed)
Post Partum Day 1  Subjective: no complaints, up ad lib, voiding, tolerating PO, + flatus and and BM today. Bonding well iwth baby. Denies HA or CP or SOB. Feels ready for discharge to home today after baby circumcision  Objective: Blood pressure (!) 91/57, pulse 73, temperature 97.6 F (36.4 C), temperature source Oral, resp. rate 17, height 5\' 3"  (1.6 m), weight 74.8 kg, last menstrual period 05/24/2018, SpO2 100 %, unknown if currently breastfeeding.  Physical Exam:  General: alert, cooperative and no distress Lochia: appropriate Uterine Fundus: firm Incision: none DVT Evaluation: No evidence of DVT seen on physical exam.  Recent Labs    02/21/19 0816 02/22/19 0609  HGB 12.9 11.2*  HCT 37.6 33.4*    Assessment/Plan: Discharge home, Breastfeeding and Circumcision prior to discharge  F/u in 6 weeks - instructions reviewed   LOS: 1 day   [place] 02/22/2019, 1:28 PM

## 2019-02-22 NOTE — Lactation Note (Signed)
This note was copied from a baby's chart. Lactation Consultation Note  Patient Name: [name] [name] Today's Date: 02/22/2019  Infant just turned 46 hrs old. Mom said infant "is a great eater!" Mom is not having to use a nipple shield with this infant like she had to with her older son. Mom also has a history of an abundant supply with her older child.  Mom is hearing swallows when infant is at the breast (and has also seen some colostrum in his spit-ups). She has no concerns. Mom is aware that his gape tends to narrow/slide down towards nipple as he gets sleepy, but Mom is [place]-sufficient and knows how to remedy that.  Mom knows how to reach [place] post-discharge, if needed.   [name] [name] [facility] 02/22/2019, 2:08 PM

## 2019-02-22 NOTE — Anesthesia Postprocedure Evaluation (Signed)
Anesthesia Post Note  Patient: [name] [name]  Procedure(s) Performed: AN AD HOC LABOR EPIDURAL     Patient location during evaluation: Mother Baby Anesthesia Type: Epidural Level of consciousness: awake and alert Pain management: pain level controlled Vital Signs Assessment: post-procedure vital signs reviewed and stable Respiratory status: spontaneous breathing, nonlabored ventilation and respiratory function stable Cardiovascular status: stable Postop Assessment: no headache, no backache, epidural receding, no apparent nausea or vomiting, patient able to bend at knees, adequate PO intake and able to ambulate Anesthetic complications: no    Last Vitals:  Vitals:   02/22/19 0100 02/22/19 0500  BP: (!) 95/53 (!) 91/57  Pulse: 74 73  Resp: 18 17  Temp: (!) 36.4 C 36.4 C  SpO2: 98% 100%    Last Pain:  Vitals:   02/22/19 0500  TempSrc: Oral  PainSc: 0-No pain   Pain Goal:                   [employer]

## 2019-02-23 LAB — THYROGLOBULIN ANTIBODY: Thyroglobulin Antibody: 69.8 IU/mL — ABNORMAL HIGH (ref 0.0–0.9)

## 2019-12-28 ENCOUNTER — Encounter: Payer: Self-pay | Admitting: Physician Assistant

## 2019-12-28 ENCOUNTER — Other Ambulatory Visit (HOSPITAL_COMMUNITY): Payer: Self-pay | Admitting: Physician Assistant

## 2019-12-28 NOTE — Progress Notes (Signed)
I connected by phone with [name] [name] on 12/28/2019 at 5:05 PM to discuss the potential use of a new treatment for mild to moderate COVID-19 viral infection in non-hospitalized patients.  This patient is a 34 y.o. female that meets the FDA criteria for Emergency Use Authorization of COVID monoclonal antibody casirivimab/imdevimab or bamlanivimab/eteseviamb.  Has a (+) direct SARS-CoV-2 viral test result  Has mild or moderate COVID-19   Is NOT hospitalized due to COVID-19  Is within 10 days of symptom onset  Has at least one of the high risk factor(s) for progression to severe COVID-19 and/or hospitalization as defined in EUA.  Specific high risk criteria : BMI > 25 and Chronic Lung Disease asthma    I have spoken and communicated the following to the patient or parent/caregiver regarding COVID monoclonal antibody treatment:  1. FDA has authorized the emergency use for the treatment of mild to moderate COVID-19 in adults and pediatric patients with positive results of direct SARS-CoV-2 viral testing who are 82 years of age and older weighing at least 40 kg, and who are at high risk for progressing to severe COVID-19 and/or hospitalization.  2. The significant known and potential risks and benefits of COVID monoclonal antibody, and the extent to which such potential risks and benefits are unknown.  3. Information on available alternative treatments and the risks and benefits of those alternatives, including clinical trials.  4. Patients treated with COVID monoclonal antibody should continue to Thal-isolate and use infection control measures (e.g., wear mask, isolate, social distance, avoid sharing personal items, clean and disinfect "high touch" surfaces, and frequent handwashing) according to CDC guidelines.   5. The patient or parent/caregiver has the option to accept or refuse COVID monoclonal antibody treatment.  After reviewing this information with the patient, the patient has  agreed to receive one of the available covid 19 monoclonal antibodies and will be provided an appropriate fact sheet prior to infusion. Konrad Felix, PA-C 12/28/2019 5:05 PM

## 2019-12-29 ENCOUNTER — Ambulatory Visit (HOSPITAL_COMMUNITY): Payer: 59

## 2020-03-12 ENCOUNTER — Ambulatory Visit: Payer: 59 | Admitting: Neurology

## 2020-04-09 ENCOUNTER — Ambulatory Visit: Payer: 59 | Admitting: Neurology

## 2020-06-10 ENCOUNTER — Ambulatory Visit: Payer: 59 | Admitting: Neurology

## 2020-06-10 ENCOUNTER — Encounter: Payer: Self-pay | Admitting: Neurology

## 2020-06-10 VITALS — BP 109/73 | HR 76 | Ht 63.0 in | Wt 142.0 lb

## 2020-06-10 DIAGNOSIS — Z820 Family history of epilepsy and other diseases of the nervous system: Secondary | ICD-10-CM

## 2020-06-10 DIAGNOSIS — R4789 Other speech disturbances: Secondary | ICD-10-CM

## 2020-06-10 DIAGNOSIS — G3184 Mild cognitive impairment, so stated: Secondary | ICD-10-CM

## 2020-06-10 DIAGNOSIS — R410 Disorientation, unspecified: Secondary | ICD-10-CM

## 2020-06-10 DIAGNOSIS — R413 Other amnesia: Secondary | ICD-10-CM

## 2020-06-10 DIAGNOSIS — R4701 Aphasia: Secondary | ICD-10-CM | POA: Diagnosis not present

## 2020-06-10 NOTE — Addendum Note (Signed)
Addended by: Sarina Ill B on: 06/10/2020 11:21 AM   Modules accepted: Orders

## 2020-06-10 NOTE — Progress Notes (Addendum)
[facility]    Provider:  Dr Jaynee Eagles Requesting Provider: Jamie Kato Primary Care Provider:  Aurea Graff, PA-C (Inactive)  CC:  evaluation of early-onset dementia at the age of 26   HPI:  [name] [name] is a 35 y.o. female here as requested by Ramiro Harvest, PA-C for evaluation of early-onset dementia at the age of 36.  I reviewed Fredderick Phenix notes: Patient asked for genetic testing, they advise given her family's history of dementia on both sides she is an increased rest but given her age is likely nothing further at this time, but the patient is adamant that she would like referral to a neurologist to discuss testing and possibly genetic testing, the patient does not have major concerns for her memory today but she does have a family history of early onset dementia and she states that her memory has become a major stressor for her over the past years.  She is aware decreased memory changes could be related to having children and decreased sleep but she still would like further testing, referral has been placed.  She has multiple relatives with early onset dementia, patient would like to discuss her risk factors and undergo genetic testing available if she meets criteria.  I reviewed lab work which included a normal CBC with differential, normal CMP with BUN 17 and creatinine is 0.83 December 21, 2019.  She is here and she knows she has increased risk of dementia. Her paternal grandmother has alzheimer's, more short-term memory loss initially, lots of repeating. Also her maternal grandmother had it in her 46s, her mother is 26 and is still doing well but forgets things as well. Patient had covid twice in a year and she gets "mom brain" with 2 young kids. She loses words, she knows what she wants to say but can't get it out, she loses her thoughts and feels it is more consistent and more often, she mies up her words a lot she will jumble thoughts and  words, she may forget thinks like leaving her phone but she has a very active lifestyle with kids. She forgets to pay bills but that may be avoidance she is huge procrastinator, no hallucinations or delusions. 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 She had some trauma in her past and had a stillborn now she has a 75 and 35 year old. She is not sleeping well, because of her kids, they all sleep together (discussed better sleep hygiene). Tsh is abnormal and working with endocrinologist. No other focal neurologic deficits, associated symptoms, inciting events or modifiable factors.  Reviewed notes, labs and imaging from outside physicians, which showed: see above  Review of Systems: Patient complains of symptoms per HPI as well as the following symptoms: cognitive decline. Pertinent negatives and positives per HPI. All others negative.   Social History   Socioeconomic History  . Marital status: Married    Spouse name: Not on file  . Number of children: 2  . Years of education: Not on file  . Highest education level: Bachelor's degree (e.g., BA, AB, BS)  Occupational History  . Not on file  Tobacco Use  . Smoking status: Never Smoker  . Smokeless tobacco: Never Used  Vaping Use  . Vaping Use: Never used  Substance and Sexual Activity  . Alcohol use: Not Currently    Alcohol/week: 3.0 standard drinks    Types: 3 Glasses of wine per  week  . Drug use: Never  . Sexual activity: Yes  Other Topics Concern  . Not on file  Social History Narrative   Lives alone with spouse and children and 2 little dogs   Right handed   Caffeine: 3 servings daily, coffee, soda, tea   Social Determinants of Health   Financial Resource Strain: Not on file  Food Insecurity: Not on file  Transportation Needs: Not on file  Physical Activity: Not on file  Stress: Not on file  Social Connections: Not on file  Intimate Partner Violence: Not on file     Family History  Problem Relation Age of Onset  . Cancer Maternal Grandmother   . Heart disease Maternal Grandmother   . Dementia Maternal Grandmother   . Depression Mother   . Hypothyroidism Mother   . Memory loss Mother        forgetful  . Anxiety disorder Father   . Depression Father   . Hyperlipidemia Father   . Dementia Maternal Grandfather   . Dementia Paternal Grandmother        diagnosed early 1s  . Diabetes Paternal Grandmother   . CAD Paternal Grandmother     Past Medical History:  Diagnosis Date  . Anxiety   . Asthma   . Depression 2016   "after daughter was stillborn" (08/24/2017)  . Gestational diabetes 2018  . Graves disease   . Hypertension 2016   "during pregnancy only" (08/24/2017)  . Hypothyroidism    after 2nd pregnancy, resolved and no longer requires medication. OB checks TSH regularly.   . Postpartum depression 2018  . Psoriatic arthritis [facility])     Patient Active Problem List   Diagnosis Date Noted  . Indication for care in labor and delivery, antepartum 02/21/2019  . VBAC, delivered 02/21/2019  . Acute appendicitis 08/24/2017  . History of IUFD 2016-10-11  . S/P cesarean section 2016-10-11  . Intrauterine fetal death 2014-09-17  . Fetal demise 08/19/2014  . [redacted] weeks gestation of pregnancy     Past Surgical History:  Procedure Laterality Date  . APPENDECTOMY  08/24/2017  . CESAREAN SECTION N/A October 11, 2016   Procedure: CESAREAN SECTION;  Surgeon: Janyth Contes, MD;  Location: [facility];  Service: Obstetrics;  Laterality: N/A;  . LAPAROSCOPIC APPENDECTOMY N/A 08/24/2017   Procedure: APPENDECTOMY LAPAROSCOPIC;  Surgeon: Erroll Luna, MD;  Location: [place];  Service: General;  Laterality: N/A;  . WISDOM TOOTH EXTRACTION  2004    Current Outpatient Medications  Medication Sig Dispense Refill  . albuterol (PROVENTIL HFA;VENTOLIN HFA) 108 (90 BASE) MCG/ACT inhaler Inhale 1 puff into the lungs every 6 (six) hours as needed  for wheezing or shortness of breath.    Marland Kitchen aspirin EC 81 MG tablet Take 81 mg by mouth as needed.    Marland Kitchen METRONIDAZOLE PO Take by mouth.    . Prenatal Vit-Fe Fumarate-FA (PRENATAL PO) Take by mouth.    . cetirizine (ZYRTEC) 10 MG tablet 1 tablet (Patient not taking: Reported on 06/10/2020)     No current facility-administered medications for this visit.    Allergies as of 06/10/2020 - Review Complete 06/10/2020  Allergen Reaction Noted  . Cats claw [uncaria tomentosa (cats claw)] Shortness Of Breath, Itching, Rash, and Other (See Comments) 08/26/2017  . Mold extract [trichophyton] Other (See Comments) 08/26/2017    Vitals: BP 109/73 (BP Location: Right Arm, Patient Position: Sitting)   Pulse 76   Ht 5\' 3"  (1.6 m)   Wt 142 lb (64.4 kg)  Breastfeeding Yes Comment: delivered 02/2019  BMI 25.15 kg/m  Last Weight:  Wt Readings from Last 1 Encounters:  06/10/20 142 lb (64.4 kg)   Last Height:   Ht Readings from Last 1 Encounters:  06/10/20 5\' 3"  (1.6 m)     Physical exam: Exam: Gen: NAD, conversant, well nourised, well groomed                     CV: RRR, no MRG. No Carotid Bruits. No peripheral edema, warm, nontender Eyes: Conjunctivae clear without exudates or hemorrhage  Neuro: Detailed Neurologic Exam  Speech:    Speech is normal; fluent and spontaneous with normal comprehension.  Cognition:    The patient is oriented to person, place, and time;     recent and remote memory intact;     language fluent;     normal attention, concentration,     fund of knowledge Cranial Nerves:    The pupils are equal, round, and reactive to light. The fundi are flat. Visual fields are full to finger confrontation. Extraocular movements are intact. Trigeminal sensation is intact and the muscles of mastication are normal. The face is symmetric. The palate elevates in the midline. Hearing intact. Voice is normal. Shoulder shrug is normal. The tongue has normal motion without fasciculations.    Coordination:    No dysmetria or ataxia  Gait:    normal.   Motor Observation:    No asymmetry, no atrophy, and no involuntary movements noted. Tone:    Normal muscle tone.    Posture:    Posture is normal. normal erect    Strength:    Strength is V/V in the upper and lower limbs.      Sensation: intact to LT     Reflex Exam:  DTR's:    Deep tendon reflexes in the upper and lower extremities are normal bilaterally.   Toes:    The toes are downgoing bilaterally.   Clonus:    Clonus is absent.    Assessment/Plan: 35 y.o. female here as requested by Ramiro Harvest, PA-C for evaluation of early-onset dementia at the age of 42.  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  However given concerning symptoms including aphasia, short-term memory loss, I do think patient would benefit from an MRI of the brain to ensure that she is not having early strokes or multiple sclerosis given her autoimmune history.Dr. Felecia Shelling to READ  She is easily distracted and not able to prioritize, difficulty finishing tasks, I suspect possibly ADHD will send for formal memory testing given this and her FHx of dementia.  I provided her with "The XX Brain" book by Eden Emms forward by Donato [employer] about empowering women to decrease risk of Alzheimer's dementia.  We also talked about the "M IND" diet from Irena Reichmann is a 30% decrease in Alzheimer risk.  Discussed clinical trials and the healthy brain study [facility], unfortunately she is too young for that but may be able to participate in the future.  The best thing patient can do right now is stay healthy and manage vascular risk factors.  I had a talk with her about genetic testing, the pros and cons, at this time I do not recommend that she had genetic testing but in the future if she  would like to she  should go speak with the genetic counselor first up at [facility] This Encounter  Procedures  . MR BRAIN W WO CONTRAST  . B12 and Folate Panel  . Methylmalonic acid, serum  . Vitamin B1  . Vitamin D, 25-hydroxy  . Homocysteine  . CBC with Differential/Platelets  . Comprehensive metabolic panel  . Ambulatory referral to Neuropsychology   No orders of the defined types were placed in this encounter.   Cc: Rosana Berger, PA-C (Inactive)  Sarina Ill, MD  [facility] [address] [place], [place] [address]  Phone [phone] Fax [phone]

## 2020-06-10 NOTE — Patient Instructions (Addendum)
Blood work MRI of the brain Can always proceed to formal memory testing The MIND diet  Memory Compensation Strategies  1. Use "WARM" strategy.  W= write it down  A= associate it  R= repeat it  M= make a mental note  2.   You can keep a Social worker.  Use a 3-ring notebook with sections for the following: calendar, important names and phone numbers,  medications, doctors' names/phone numbers, lists/reminders, and a section to journal what you did  each day.   3.    Use a calendar to write appointments down.  4.    Write yourself a schedule for the day.  This can be placed on the calendar or in a separate section of the Memory Notebook.  Keeping a  regular schedule can help memory.  5.    Use medication organizer with sections for each day or morning/evening pills.  You may need help loading it  6.    Keep a basket, or pegboard by the door.  Place items that you need to take out with you in the basket or on the pegboard.  You may also want to  include a message board for reminders.  7.    Use sticky notes.  Place sticky notes with reminders in a place where the task is performed.  For example: " turn off the  stove" placed by the stove, "lock the door" placed on the door at eye level, " take your medications" on  the bathroom mirror or by the place where you normally take your medications.  8.    Use alarms/timers.  Use while cooking to remind yourself to check on food or as a reminder to take your medicine, or as a  reminder to make a call, or as a reminder to perform another task, etc.

## 2020-06-11 ENCOUNTER — Telehealth: Payer: Self-pay | Admitting: Neurology

## 2020-06-11 NOTE — Telephone Encounter (Signed)
spoke to the pt due to the cost she wants to see how far away she is to meeting her ded & then will get back with me  [facility] auth: [id number] (exp. 06/10/20 to 07/25/20)

## 2020-06-19 ENCOUNTER — Telehealth: Payer: Self-pay | Admitting: *Deleted

## 2020-06-19 NOTE — Telephone Encounter (Signed)
Spoke with patient and discussed lab results. Pt aware of concern she may be B12 deficient and is aware of the symptoms of B12 deficiency. Pt aware to purchase B12 OTC and take 1000-2000 mcg daily. Also aware that Vitamin D level is low and she should take OTC Vit D 1000-2000 IUs per day. Recheck B12 in 3 months by PCP. Pt verbalized appreciation and her questions were answered.

## 2020-06-19 NOTE — Telephone Encounter (Signed)
-----   Message from Melvenia Beam, MD sent at 06/15/2020 10:39 AM EDT ----- B12 is technically normal but it is so low that you could still be B12 deficient.B12 deficiency can cause weakness, fatigue, easy bruising or bleeding,sore tongue, stomach upset, weight loss, and diarrhea or constipation, tingling or numbness to the fingers and toes, difficulty walking, mood changes, depression, memory loss, disorientation and, in severe cases, dementia. Recommend 1000-2000 mcg B12 daily. recheck B12 in 3 months. I recommend getting some over the counter B12, we want your B12 over 400 and yours is 278(less than 232 is deficiency). Also your vitamin D is very low recommend over the counter vitamin D as well around 1000-2000IU daily. Thanks, Dr Jaynee Eagles

## 2020-06-21 LAB — COMPREHENSIVE METABOLIC PANEL
ALT: 19 IU/L (ref 0–32)
AST: 19 IU/L (ref 0–40)
Albumin/Globulin Ratio: 1.8 (ref 1.2–2.2)
Albumin: 4.8 g/dL (ref 3.8–4.8)
Alkaline Phosphatase: 116 IU/L (ref 44–121)
BUN/Creatinine Ratio: 17 (ref 9–23)
BUN: 12 mg/dL (ref 6–20)
Bilirubin Total: 0.4 mg/dL (ref 0.0–1.2)
CO2: 23 mmol/L (ref 20–29)
Calcium: 9.6 mg/dL (ref 8.7–10.2)
Chloride: 104 mmol/L (ref 96–106)
Creatinine, Ser: 0.71 mg/dL (ref 0.57–1.00)
Globulin, Total: 2.6 g/dL (ref 1.5–4.5)
Glucose: 95 mg/dL (ref 65–99)
Potassium: 4.8 mmol/L (ref 3.5–5.2)
Sodium: 141 mmol/L (ref 134–144)
Total Protein: 7.4 g/dL (ref 6.0–8.5)
eGFR: 114 mL/min/{1.73_m2} (ref >59)

## 2020-06-21 LAB — CBC WITH DIFFERENTIAL/PLATELET
Basophils Absolute: 0.1 10*3/uL (ref 0.0–0.2)
Basos: 1 %
EOS (ABSOLUTE): 0.2 10*3/uL (ref 0.0–0.4)
Eos: 4 %
Hematocrit: 41.9 % (ref 34.0–46.6)
Hemoglobin: 13.8 g/dL (ref 11.1–15.9)
Immature Grans (Abs): 0 10*3/uL (ref 0.0–0.1)
Immature Granulocytes: 0 %
Lymphocytes Absolute: 1.9 10*3/uL (ref 0.7–3.1)
Lymphs: 38 %
MCH: 30.7 pg (ref 26.6–33.0)
MCHC: 32.9 g/dL (ref 31.5–35.7)
MCV: 93 fL (ref 79–97)
Monocytes Absolute: 0.4 10*3/uL (ref 0.1–0.9)
Monocytes: 9 %
Neutrophils Absolute: 2.3 10*3/uL (ref 1.4–7.0)
Neutrophils: 48 %
Platelets: 296 10*3/uL (ref 150–450)
RBC: 4.49 x10E6/uL (ref 3.77–5.28)
RDW: 12.3 % (ref 11.7–15.4)
WBC: 4.8 10*3/uL (ref 3.4–10.8)

## 2020-06-21 LAB — B12 AND FOLATE PANEL
Folate: 19.5 ng/mL (ref >3.0)
Vitamin B-12: 278 pg/mL (ref 232–1245)

## 2020-06-21 LAB — VITAMIN B1: Thiamine: 128.7 nmol/L (ref 66.5–200.0)

## 2020-06-21 LAB — HOMOCYSTEINE: Homocysteine: 7.8 umol/L (ref 0.0–14.5)

## 2020-06-21 LAB — VITAMIN D 25 HYDROXY (VIT D DEFICIENCY, FRACTURES): Vit D, 25-Hydroxy: 29.3 ng/mL — ABNORMAL LOW (ref 30.0–100.0)

## 2020-06-21 LAB — METHYLMALONIC ACID, SERUM: Methylmalonic Acid: 188 nmol/L (ref 0–378)

## 2020-11-13 ENCOUNTER — Ambulatory Visit: Payer: 59 | Admitting: Internal Medicine

## 2020-12-25 NOTE — Progress Notes (Signed)
Cardiology Office Note:    Date:  12/26/2020   ID:  [name] [name], DOB [birth year], MRN [id number]  PCP:  Aurea Graff, PA-C (Inactive)  Cardiologist:  None  Electrophysiologist:  None   Referring MD: Ramiro Harvest, PA-C   Chief Complaint/Reason for Referral: Palpitations  History of Present Illness:    [name] [name] is a 35 y.o. female with a history of gestational diabetes, hypertension, hypothyroidism, anxiety, asthma, depression, psoriatic arthritis and Graves disease, here for the evaluation of palpitations.  Overall, she is very concerned because she has a significant family history of heart disease and MI.  Her palpitations have been ongoing for at least 2 years. Episodes occur randomly, about 1-2 times per month but do not seem to be more frequent.  She describes her episodes as feeling "butterflies in my heart." Of note, she knows her palpitations are different than her feelings of panic or anxiety. Typically her palpitations are brief, lasting only a few seconds. Commonly her episodes are associated with lightheadedness and sometimes shortness of breath.  However, she reports her shortness of breath and dizziness may occur without palpitations. She feels this may be attributable to her thyroid. She has Graves disease and notes recently normal TSH.  For about 8 months, she has experienced sporadic numbness of the 4th and 5th digits on the right hand. This may last for up to 1 day, and occur as often as every other day. We discussed that this is the ulnar distribution, and less likely to be related to a cardiovascular origin.  In her lifetime, she previously had one syncopal episode. Additionally, last week while diving she felt near-syncopal. This occurred without warning, and with no trigger that she could determine. She pulled over and took deep breaths until her symptoms resolved.   Usually has one caffeinated cold brew tea a day. She has never smoked. Alcohol  consumption is typically one glass of red wine about 3-5 times a week. She has not noticed any association between her palpitations and consuming alcohol.  For activity, she plans on restarting yoga exercises.  Grandmother had CABG x3, Alzheimer's, and died of heart attack. Paternal Uncle had CABG x4 in his 76s. Great grandfather died of heart attack at an early age.  She also endorses undiagnosed ADHD. The patient denies chest pain, chest pressure, PND, orthopnea, or leg swelling. Denies cough, fever, chills. Denies nausea, vomiting. Denies snoring.  She has been infected with COVID 3 times.   Past Medical History:  Diagnosis Date   Anxiety    Asthma    Depression 2016   "after daughter was stillborn" (08/24/2017)   Gestational diabetes 2018   Graves disease    Hypertension 2016   "during pregnancy only" (08/24/2017)   Hypothyroidism    after 2nd pregnancy, resolved and no longer requires medication. OB checks TSH regularly.    Postpartum depression 2018   Psoriatic arthritis [facility])     Past Surgical History:  Procedure Laterality Date   APPENDECTOMY  08/24/2017   CESAREAN SECTION N/A 09/13/2016   Procedure: CESAREAN SECTION;  Surgeon: Janyth Contes, MD;  Location: [place];  Service: Obstetrics;  Laterality: N/A;   LAPAROSCOPIC APPENDECTOMY N/A 08/24/2017   Procedure: APPENDECTOMY LAPAROSCOPIC;  Surgeon: Erroll Luna, MD;  Location: [place];  Service: General;  Laterality: N/A;   [place] EXTRACTION  2004    Current Medications: Current Meds  Medication Sig   albuterol (PROVENTIL HFA;VENTOLIN HFA) 108 (90 BASE) MCG/ACT inhaler Inhale  1 puff into the lungs every 6 (six) hours as needed for wheezing or shortness of breath.   aspirin EC 81 MG tablet Take 81 mg by mouth as needed.   aspirin EC 81 MG tablet    cetirizine (ZYRTEC) 10 MG tablet    ondansetron (ZOFRAN) 4 MG tablet Take 4 mg by mouth daily.   Prenatal Vit-Fe Fumarate-FA (PRENATAL PO) Take by  mouth.   propylthiouracil (PTU) 50 MG tablet Take 50 mg by mouth 2 (two) times daily.   vitamin B-12 (CYANOCOBALAMIN) 100 MCG tablet      Allergies:   Cats claw [uncaria tomentosa (cats claw)] and Mold extract [trichophyton]   Social History   Tobacco Use   Smoking status: Never   Smokeless tobacco: Never  Vaping Use   Vaping Use: Never used  Substance Use Topics   Alcohol use: Not Currently    Alcohol/week: 3.0 standard drinks    Types: 3 Glasses of wine per week   Drug use: Never     Family History: The patient's family history includes Anxiety disorder in her father; CAD in her paternal grandmother; Cancer in her maternal grandmother; Dementia in her maternal grandfather, maternal grandmother, and paternal grandmother; Depression in her father and mother; Diabetes in her paternal grandmother; Heart disease in her maternal grandmother; Hyperlipidemia in her father; Hypothyroidism in her mother; Memory loss in her mother.  ROS:   Please see the history of present illness.    (+) Palpitations (+) Dizziness/Lightheadedness (+) Shortness of breath (+) Numbness in fingers, right-hand (+) Near-syncope (+) Anxiety All other systems reviewed and are negative.  EKGs/Labs/Other Studies Reviewed:    The following studies were reviewed today:  CTA Chest 08/26/2017:   IMPRESSION: 1.  Negative for acute pulmonary embolus. 2. Negative CTA appearance of the chest; minor pulmonary atelectasis. 3. Post appendectomy trace pneumoperitoneum in the upper abdomen.  EKG:   12/26/2020: Sinus rhythm. Rate 82 bpm.  Imaging studies that I have independently reviewed today: CTA chest as above.  Recent Labs: 06/10/2020: ALT 19; BUN 12; Creatinine, Ser 0.71; Hemoglobin 13.8; Platelets 296; Potassium 4.8; Sodium 141   Recent Lipid Panel No results found for: CHOL, TRIG, HDL, CHOLHDL, VLDL, LDLCALC, LDLDIRECT  Physical Exam:    VS:  BP (!) 85/60   Pulse 82   Ht 5\' 3"  (1.6 m)   Wt 144 lb  6.4 oz (65.5 kg)   SpO2 99%   BMI 25.58 kg/m     Wt Readings from Last 5 Encounters:  12/26/20 144 lb 6.4 oz (65.5 kg)  06/10/20 142 lb (64.4 kg)  02/21/19 165 lb (74.8 kg)  08/24/17 131 lb 2.8 oz (59.5 kg)  09/13/16 151 lb (68.5 kg)    Constitutional: No acute distress Eyes: sclera non-icteric, normal conjunctiva and lids ENMT: normal dentition, moist mucous membranes Cardiovascular: regular rhythm, normal rate, no murmur. S1 and S2 normal. No jugular venous distention.  Respiratory: clear to auscultation bilaterally GI : normal bowel sounds, soft and nontender. No distention.   MSK: extremities warm, well perfused. No edema.  NEURO: grossly nonfocal exam, moves all extremities. PSYCH: alert and oriented x 3, normal mood and affect.   ASSESSMENT:    1. Hyperlipidemia, unspecified hyperlipidemia type   2. Palpitations   3. Dizziness    PLAN:    Hyperlipidemia, unspecified hyperlipidemia type - Plan: Lipid panel - LDL on 12/21/19 was 148, HDL 60 ,Trig 48. Recommend repeat lipid panel to reassess. Can also consider coronary calcium score to  risk stratify though not validated for patients < 22 yo.   Palpitations - Plan: EKG 12-Lead, ECHOCARDIOGRAM COMPLETE, CARDIAC EVENT MONITOR Dizziness - Plan: EKG 12-Lead, ECHOCARDIOGRAM COMPLETE, CARDIAC EVENT MONITOR -Order Echo and Monitor to initiate workup of palpitations and dizziness per patient preference. May have difficulty adding medical therapy for symptoms if BP is low however will reassess at follow up.   Follow-up in 2-3 months s/p testing.  Total time of encounter: 60 minutes total time of encounter, including 40 minutes spent in face-to-face patient care on the date of this encounter. This time includes coordination of care and counseling regarding above mentioned problem list. Remainder of non-face-to-face time involved reviewing chart documents/testing relevant to the patient encounter and documentation in the medical  record. I have independently reviewed documentation from referring provider.   Cherlynn Kaiser, MD, [place]   Shared Decision Making/Informed Consent:       Medication Adjustments/Labs and Tests Ordered: Current medicines are reviewed at length with the patient today.  Concerns regarding medicines are outlined above.   Orders Placed This Encounter  Procedures   Lipid panel   CARDIAC EVENT MONITOR   EKG 12-Lead   ECHOCARDIOGRAM COMPLETE     No orders of the defined types were placed in this encounter.   Patient Instructions  Medication Instructions:  Your Physician recommend you continue on your current medication as directed.      *If you need a refill on your cardiac medications before your next appointment, please call your pharmacy*   Lab Work: Your physician recommends that you return for lab work when fasting (lipids)  If you have labs (blood work) drawn today and your tests are completely normal, you will receive your results only by: [place] (if you have MyChart) OR A paper copy in the mail If you have any lab test that is abnormal or we need to change your treatment, we will call you to review the results.   Testing/Procedures: Your physician has requested that you have an echocardiogram. Echocardiography is a painless test that uses sound waves to create images of your heart. It provides your doctor with information about the size and shape of your heart and how well your heart's chambers and valves are working. This procedure takes approximately one hour. There are no restrictions for this procedure. [place] has recommended that you wear an event monitor. Event monitors are medical devices that record the heart's electrical activity. Doctors most often [place] these monitors to diagnose arrhythmias. Arrhythmias are problems with the speed or rhythm of the heartbeat. The monitor is a small,  portable device. You can wear one while you do your normal daily activities. This is usually used to diagnose what is causing palpitations/syncope (passing out).    Follow-Up: At [facility], you and your health needs are our priority.  As part of our continuing mission to provide you with exceptional heart care, we have created designated Provider Care Teams.  These Care Teams include your primary Cardiologist (physician) and Advanced Practice Providers (APPs -  Physician Assistants and Nurse Practitioners) who all work together to provide you with the care you need, when you need it.  We recommend signing up for the patient portal called "MyChart".  Sign up information is provided on this After Visit Summary.  MyChart is used to connect with patients for Virtual Visits (Telemedicine).  Patients are able to view lab/test results, encounter notes, upcoming  appointments, etc.  Non-urgent messages can be sent to your provider as well.   To learn more about what you can do with MyChart, go to [email or web address].    Your next appointment:   2 month(s)  The format for your next appointment:   In Person  Provider:   Buford Dresser, MD  [place]; [address]    I,Mathew Stumpf,acting as a scribe for Elouise Munroe, MD.,have documented all relevant documentation on the behalf of Elouise Munroe, MD,as directed by  Elouise Munroe, MD while in the presence of Elouise Munroe, MD.  I, Elouise Munroe, MD, have reviewed all documentation for this visit. The documentation on today's date of service for the exam, diagnosis, procedures, and orders are all accurate and complete.

## 2020-12-26 ENCOUNTER — Ambulatory Visit: Payer: 59 | Admitting: Internal Medicine

## 2020-12-26 ENCOUNTER — Other Ambulatory Visit: Payer: Self-pay

## 2020-12-26 VITALS — BP 85/60 | HR 82 | Ht 63.0 in | Wt 144.4 lb

## 2020-12-26 DIAGNOSIS — E785 Hyperlipidemia, unspecified: Secondary | ICD-10-CM

## 2020-12-26 DIAGNOSIS — R002 Palpitations: Secondary | ICD-10-CM | POA: Diagnosis not present

## 2020-12-26 DIAGNOSIS — R42 Dizziness and giddiness: Secondary | ICD-10-CM | POA: Diagnosis not present

## 2020-12-26 NOTE — Patient Instructions (Signed)
Medication Instructions:  Your Physician recommend you continue on your current medication as directed.      *If you need a refill on your cardiac medications before your next appointment, please call your pharmacy*   Lab Work: Your physician recommends that you return for lab work when fasting (lipids)  If you have labs (blood work) drawn today and your tests are completely normal, you will receive your results only by: [place] (if you have MyChart) OR A paper copy in the mail If you have any lab test that is abnormal or we need to change your treatment, we will call you to review the results.   Testing/Procedures: Your physician has requested that you have an echocardiogram. Echocardiography is a painless test that uses sound waves to create images of your heart. It provides your doctor with information about the size and shape of your heart and how well your heart's chambers and valves are working. This procedure takes approximately one hour. There are no restrictions for this procedure. [place] has recommended that you wear an event monitor. Event monitors are medical devices that record the heart's electrical activity. Doctors most often [place] these monitors to diagnose arrhythmias. Arrhythmias are problems with the speed or rhythm of the heartbeat. The monitor is a small, portable device. You can wear one while you do your normal daily activities. This is usually used to diagnose what is causing palpitations/syncope (passing out).    Follow-Up: At [facility], you and your health needs are our priority.  As part of our continuing mission to provide you with exceptional heart care, we have created designated Provider Care Teams.  These Care Teams include your primary Cardiologist (physician) and Advanced Practice Providers (APPs -  Physician Assistants and Nurse Practitioners) who all work together to provide you with the care you need,  when you need it.  We recommend signing up for the patient portal called "MyChart".  Sign up information is provided on this After Visit Summary.  MyChart is used to connect with patients for Virtual Visits (Telemedicine).  Patients are able to view lab/test results, encounter notes, upcoming appointments, etc.  Non-urgent messages can be sent to your provider as well.   To learn more about what you can do with MyChart, go to [email or web address].    Your next appointment:   2 month(s)  The format for your next appointment:   In Person  Provider:   Buford Dresser, MD  [place]; [phone]

## 2021-01-17 ENCOUNTER — Ambulatory Visit (HOSPITAL_COMMUNITY): Payer: 59

## 2021-02-11 ENCOUNTER — Other Ambulatory Visit (HOSPITAL_COMMUNITY): Payer: 59

## 2021-02-11 ENCOUNTER — Encounter (HOSPITAL_COMMUNITY): Payer: Self-pay

## 2021-02-20 ENCOUNTER — Ambulatory Visit (HOSPITAL_BASED_OUTPATIENT_CLINIC_OR_DEPARTMENT_OTHER): Payer: 59 | Admitting: Cardiology

## 2021-02-27 ENCOUNTER — Ambulatory Visit (HOSPITAL_COMMUNITY): Payer: 59 | Attending: Internal Medicine

## 2021-02-27 ENCOUNTER — Other Ambulatory Visit: Payer: Self-pay

## 2021-02-27 DIAGNOSIS — R002 Palpitations: Secondary | ICD-10-CM | POA: Diagnosis not present

## 2021-02-27 DIAGNOSIS — R0602 Shortness of breath: Secondary | ICD-10-CM

## 2021-02-27 DIAGNOSIS — R42 Dizziness and giddiness: Secondary | ICD-10-CM | POA: Diagnosis present

## 2021-02-27 LAB — ECHOCARDIOGRAM COMPLETE
Area-P 1/2: 4.85 cm2
S' Lateral: 2.9 cm

## 2021-03-28 ENCOUNTER — Ambulatory Visit (HOSPITAL_BASED_OUTPATIENT_CLINIC_OR_DEPARTMENT_OTHER): Payer: 59 | Admitting: Cardiology

## 2021-03-28 NOTE — Progress Notes (Incomplete)
Cardiology Office Note:    Date:  03/28/2021   ID:  [name] [name], DOB [birth year], MRN [id number]  PCP:  Aurea Graff, PA-C (Inactive)  Cardiologist:  None  Referring MD: No ref. provider found   No chief complaint on file.   History of Present Illness:    [name] [name] is a 36 y.o. female with a hx of gestational diabetes, hypertension, hypothyroidism, anxiety, asthma, depression, psoriatic arthritis, and Graves disease, who presents for follow-up and is seen for Dr. Margaretann Loveless.  She saw Dr. Margaretann Loveless 12/26/2020 for the evaluation of palpitations. An echocardiogram and monitor were ordered.  Cardiovascular risk factors: Prior clinical ASCVD:  Comorbid conditions, including hypertension, hyperlipidemia, diabetes, chronic kidney disease:  Metabolic syndrome/Obesity: Chronic inflammatory conditions: Tobacco use history: Family history: Prior cardiac testing and/or incidental findings on other testing (ie coronary calcium): Exercise level: Current diet:  Overall,  She denies any palpitations, chest pain, or shortness of breath. No lightheadedness, headaches, syncope, orthopnea, PND, lower extremity edema or exertional symptoms.  (+)  Past Medical History:  Diagnosis Date   Anxiety    Asthma    Depression 2016   "after daughter was stillborn" (08/24/2017)   Gestational diabetes 2018   Graves disease    Hypertension 2016   "during pregnancy only" (08/24/2017)   Hypothyroidism    after 2nd pregnancy, resolved and no longer requires medication. OB checks TSH regularly.    Postpartum depression 2018   Psoriatic arthritis [facility])     Past Surgical History:  Procedure Laterality Date   APPENDECTOMY  08/24/2017   CESAREAN SECTION N/A 09/13/2016   Procedure: CESAREAN SECTION;  Surgeon: [name] Contes, MD;  Location: [place];  Service: Obstetrics;  Laterality: N/A;   LAPAROSCOPIC APPENDECTOMY N/A 08/24/2017   Procedure: APPENDECTOMY LAPAROSCOPIC;  Surgeon:  Erroll Luna, MD;  Location: [place];  Service: General;  Laterality: N/A;   WISDOM TOOTH EXTRACTION  2004    Current Medications: Current Outpatient Medications on File Prior to Visit  Medication Sig   albuterol (PROVENTIL HFA;VENTOLIN HFA) 108 (90 BASE) MCG/ACT inhaler Inhale 1 puff into the lungs every 6 (six) hours as needed for wheezing or shortness of breath.   aspirin EC 81 MG tablet Take 81 mg by mouth as needed.   aspirin EC 81 MG tablet    cetirizine (ZYRTEC) 10 MG tablet    METRONIDAZOLE PO Take by mouth. (Patient not taking: Reported on 12/26/2020)   ondansetron (ZOFRAN) 4 MG tablet Take 4 mg by mouth daily.   Prenatal Vit-Fe Fumarate-FA (PRENATAL PO) Take by mouth.   propylthiouracil (PTU) 50 MG tablet Take 50 mg by mouth 2 (two) times daily.   vitamin B-12 (CYANOCOBALAMIN) 100 MCG tablet    No current facility-administered medications on file prior to visit.     Allergies:   Cats claw [uncaria tomentosa (cats claw)] and Mold extract [trichophyton]   Social History   Tobacco Use   Smoking status: Never   Smokeless tobacco: Never  Vaping Use   Vaping Use: Never used  Substance Use Topics   Alcohol use: Not Currently    Alcohol/week: 3.0 standard drinks    Types: 3 Glasses of wine per week   Drug use: Never    Family History: family history includes Anxiety disorder in her father; CAD in her paternal grandmother; Cancer in her maternal grandmother; Dementia in her maternal grandfather, maternal grandmother, and paternal grandmother; Depression in her father and mother; Diabetes in her paternal grandmother; Heart disease in  her maternal grandmother; Hyperlipidemia in her father; Hypothyroidism in her mother; Memory loss in her mother.  ROS:   Please see the history of present illness.  All other systems are reviewed and negative.    EKGs/Labs/Other Studies Reviewed:    The following studies were reviewed today:  Echo 02/27/2021:  1. Left ventricular  ejection fraction, by estimation, is 55 to 60%. Left  ventricular ejection fraction by 3D volume is 57 %. The left ventricle has  normal function. The left ventricle has no regional wall motion  abnormalities. Left ventricular diastolic   parameters were normal. The average left ventricular global longitudinal  strain is -22.1 %. The global longitudinal strain is normal.   2. Right ventricular systolic function is normal. The right ventricular  size is normal.   3. The mitral valve is normal in structure. No evidence of mitral valve  regurgitation. No evidence of mitral stenosis.   4. The aortic valve is tricuspid. Aortic valve regurgitation is not  visualized. No aortic stenosis is present.   5. The inferior vena cava is normal in size with greater than 50%  respiratory variability, suggesting right atrial pressure of 3 mmHg.   Comparison(s): No prior Echocardiogram.   CTA Chest 08/26/2017:   IMPRESSION: 1.  Negative for acute pulmonary embolus. 2. Negative CTA appearance of the chest; minor pulmonary atelectasis. 3. Post appendectomy trace pneumoperitoneum in the upper abdomen.  EKG:  EKG is personally reviewed.   03/28/2021: *** 12/26/2020 (Dr. Margaretann Loveless): Sinus rhythm. Rate 82 bpm.  Recent Labs: 06/10/2020: ALT 19; BUN 12; Creatinine, Ser 0.71; Hemoglobin 13.8; Platelets 296; Potassium 4.8; Sodium 141   Recent Lipid Panel No results found for: CHOL, TRIG, HDL, CHOLHDL, VLDL, LDLCALC, LDLDIRECT  Physical Exam:    VS:  There were no vitals taken for this visit.    Wt Readings from Last 3 Encounters:  12/26/20 144 lb 6.4 oz (65.5 kg)  06/10/20 142 lb (64.4 kg)  02/21/19 165 lb (74.8 kg)    GEN: Well nourished, well developed in no acute distress HEENT: Normal, moist mucous membranes NECK: No JVD CARDIAC: regular rhythm, normal S1 and S2, no rubs or gallops. No murmur. VASCULAR: Radial and DP pulses 2+ bilaterally. No carotid bruits RESPIRATORY:  Clear to auscultation  without rales, wheezing or rhonchi  ABDOMEN: Soft, non-tender, non-distended MUSCULOSKELETAL:  Ambulates independently SKIN: Warm and dry, no edema NEUROLOGIC:  Alert and oriented x 3. No focal neuro deficits noted. PSYCHIATRIC:  Normal affect    ASSESSMENT:    No diagnosis found. PLAN:     Cardiac risk counseling and prevention recommendations: -recommend heart healthy/Mediterranean diet, with whole grains, fruits, vegetable, fish, lean meats, nuts, and olive oil. Limit salt. -recommend moderate walking, 3-5 times/week for 30-50 minutes each session. Aim for at least 150 minutes.week. Goal should be pace of 3 miles/hours, or walking 1.5 miles in 30 minutes -recommend avoidance of tobacco products. Avoid excess alcohol. -ASCVD risk score: The ASCVD Risk score (Arnett DK, et al., 2019) failed to calculate for the following reasons:   The 2019 ASCVD risk score is only valid for ages 78 to 60    Plan for follow up: *** or sooner as needed.  Buford Dresser, MD, PhD, [facility]    Medication Adjustments/Labs and Tests Ordered: Current medicines are reviewed at length with the patient today.  Concerns regarding medicines are outlined above.   No orders of the defined types were placed in this encounter.  No orders of the defined types were placed in this encounter.  There are no Patient Instructions on file for this visit.   I,Mathew Stumpf,acting as a Education administrator for [employer], MD.,have documented all relevant documentation on the behalf of Buford Dresser, MD,as directed by  Buford Dresser, MD while in the presence of Buford Dresser, MD.  ***  Signed, Buford Dresser, MD PhD 03/28/2021 9:09 AM    [place]

## 2021-04-11 NOTE — Progress Notes (Incomplete)
Cardiology Office Note:    Date:  04/11/2021   ID:  TYRONE BALASH, DOB May 25, 1985, MRN [id number]  PCP:  Aurea Graff, PA-C (Inactive)  Cardiologist:  None  Referring MD: No ref. provider found   No chief complaint on file.   History of Present Illness:    [name] [name] is a 36 y.o. female with a hx of *** who is seen as a new consult at the request of No ref. provider found for the evaluation and management of ***.  Cardiovascular risk factors: Prior clinical ASCVD:  Comorbid conditions, including hypertension, hyperlipidemia, diabetes, chronic kidney disease:  Metabolic syndrome/Obesity: Chronic inflammatory conditions: Tobacco use history: Family history: Prior cardiac testing and/or incidental findings on other testing (ie coronary calcium): Exercise level: Current diet:  Overall,  He/She denies any palpitations, chest pain, or shortness of breath. No lightheadedness, headaches, syncope, orthopnea, PND, lower extremity edema or exertional symptoms.  Past Medical History:  Diagnosis Date   Anxiety    Asthma    Depression 2016   "after daughter was stillborn" (08/24/2017)   Gestational diabetes 2018   Graves disease    Hypertension 2016   "during pregnancy only" (08/24/2017)   Hypothyroidism    after 2nd pregnancy, resolved and no longer requires medication. OB checks TSH regularly.    Postpartum depression 2018   Psoriatic arthritis [facility])     Past Surgical History:  Procedure Laterality Date   APPENDECTOMY  08/24/2017   CESAREAN SECTION N/A 09/13/2016   Procedure: CESAREAN SECTION;  Surgeon: Janyth Contes, MD;  Location: [place];  Service: Obstetrics;  Laterality: N/A;   LAPAROSCOPIC APPENDECTOMY N/A 08/24/2017   Procedure: APPENDECTOMY LAPAROSCOPIC;  Surgeon: Erroll Luna, MD;  Location: [place];  Service: General;  Laterality: N/A;   WISDOM TOOTH EXTRACTION  2004    Current Medications: Current Outpatient Medications on File Prior  to Visit  Medication Sig   albuterol (PROVENTIL HFA;VENTOLIN HFA) 108 (90 BASE) MCG/ACT inhaler Inhale 1 puff into the lungs every 6 (six) hours as needed for wheezing or shortness of breath.   aspirin EC 81 MG tablet Take 81 mg by mouth as needed.   aspirin EC 81 MG tablet    cetirizine (ZYRTEC) 10 MG tablet    METRONIDAZOLE PO Take by mouth. (Patient not taking: Reported on 12/26/2020)   ondansetron (ZOFRAN) 4 MG tablet Take 4 mg by mouth daily.   Prenatal Vit-Fe Fumarate-FA (PRENATAL PO) Take by mouth.   propylthiouracil (PTU) 50 MG tablet Take 50 mg by mouth 2 (two) times daily.   vitamin B-12 (CYANOCOBALAMIN) 100 MCG tablet    No current facility-administered medications on file prior to visit.     Allergies:   Cats claw [uncaria tomentosa (cats claw)] and Mold extract [trichophyton]   Social History   Tobacco Use   Smoking status: Never   Smokeless tobacco: Never  Vaping Use   Vaping Use: Never used  Substance Use Topics   Alcohol use: Not Currently    Alcohol/week: 3.0 standard drinks    Types: 3 Glasses of wine per week   Drug use: Never    Family History: family history includes Anxiety disorder in her father; CAD in her paternal grandmother; Cancer in her maternal grandmother; Dementia in her maternal grandfather, maternal grandmother, and paternal grandmother; Depression in her father and mother; Diabetes in her paternal grandmother; Heart disease in her maternal grandmother; Hyperlipidemia in her father; Hypothyroidism in her mother; Memory loss in her mother.  ROS:  Please see the history of present illness.  Additional pertinent ROS: Constitutional: Negative for chills, fever, night sweats, unintentional weight loss  HENT: Negative for ear pain and hearing loss.   Eyes: Negative for loss of vision and eye pain.  Respiratory: Negative for cough, sputum, wheezing.   Cardiovascular: See HPI. Gastrointestinal: Negative for abdominal pain, melena, and hematochezia.   Genitourinary: Negative for dysuria and hematuria.  Musculoskeletal: Negative for falls and myalgias.  Skin: Negative for itching and rash.  Neurological: Negative for focal weakness, focal sensory changes and loss of consciousness.  Endo/Heme/Allergies: Does not bruise/bleed easily.     EKGs/Labs/Other Studies Reviewed:    The following studies were reviewed today:  ***  EKG:  EKG is personally reviewed.   ***  Recent Labs: 06/10/2020: ALT 19; BUN 12; Creatinine, Ser 0.71; Hemoglobin 13.8; Platelets 296; Potassium 4.8; Sodium 141   Recent Lipid Panel No results found for: CHOL, TRIG, HDL, CHOLHDL, VLDL, LDLCALC, LDLDIRECT  Physical Exam:    VS:  There were no vitals taken for this visit.    Wt Readings from Last 3 Encounters:  12/26/20 144 lb 6.4 oz (65.5 kg)  06/10/20 142 lb (64.4 kg)  02/21/19 165 lb (74.8 kg)    GEN: Well nourished, well developed in no acute distress HEENT: Normal, moist mucous membranes NECK: No JVD CARDIAC: regular rhythm, normal S1 and S2, no rubs or gallops. No murmur. VASCULAR: Radial and DP pulses 2+ bilaterally. No carotid bruits RESPIRATORY:  Clear to auscultation without rales, wheezing or rhonchi  ABDOMEN: Soft, non-tender, non-distended MUSCULOSKELETAL:  Ambulates independently SKIN: Warm and dry, no edema NEUROLOGIC:  Alert and oriented x 3. No focal neuro deficits noted. PSYCHIATRIC:  Normal affect    ASSESSMENT:    No diagnosis found. PLAN:     Cardiac risk counseling and prevention recommendations: -recommend heart healthy/Mediterranean diet, with whole grains, fruits, vegetable, fish, lean meats, nuts, and olive oil. Limit salt. -recommend moderate walking, 3-5 times/week for 30-50 minutes each session. Aim for at least 150 minutes.week. Goal should be pace of 3 miles/hours, or walking 1.5 miles in 30 minutes -recommend avoidance of tobacco products. Avoid excess alcohol. -ASCVD risk score: The ASCVD Risk score (Arnett DK, et  al., 2019) failed to calculate for the following reasons:   The 2019 ASCVD risk score is only valid for ages 35 to 48    Plan for follow up: *** or sooner as needed.  Buford Dresser, MD, PhD, [facility]    Medication Adjustments/Labs and Tests Ordered: Current medicines are reviewed at length with the patient today.  Concerns regarding medicines are outlined above.   No orders of the defined types were placed in this encounter.  No orders of the defined types were placed in this encounter.  There are no Patient Instructions on file for this visit.   I,Mathew Stumpf,acting as a Education administrator for [employer], MD.,have documented all relevant documentation on the behalf of Buford Dresser, MD,as directed by  Buford Dresser, MD while in the presence of Buford Dresser, MD.  ***  Signed, Buford Dresser, MD PhD 04/11/2021 3:51 PM    [place]

## 2021-04-14 ENCOUNTER — Ambulatory Visit (HOSPITAL_BASED_OUTPATIENT_CLINIC_OR_DEPARTMENT_OTHER): Payer: 59 | Admitting: Cardiology

## 2021-04-14 ENCOUNTER — Encounter (HOSPITAL_BASED_OUTPATIENT_CLINIC_OR_DEPARTMENT_OTHER): Payer: Self-pay

## 2023-07-31 ENCOUNTER — Other Ambulatory Visit: Payer: Self-pay

## 2023-07-31 ENCOUNTER — Emergency Department (HOSPITAL_BASED_OUTPATIENT_CLINIC_OR_DEPARTMENT_OTHER)

## 2023-07-31 ENCOUNTER — Encounter (HOSPITAL_BASED_OUTPATIENT_CLINIC_OR_DEPARTMENT_OTHER): Payer: Self-pay | Admitting: Emergency Medicine

## 2023-07-31 ENCOUNTER — Emergency Department (HOSPITAL_BASED_OUTPATIENT_CLINIC_OR_DEPARTMENT_OTHER): Admission: EM | Admit: 2023-07-31 | Discharge: 2023-07-31 | Disposition: A | Discharge status: Home / Self Care

## 2023-07-31 DIAGNOSIS — Z7951 Long term (current) use of inhaled steroids: Secondary | ICD-10-CM | POA: Diagnosis not present

## 2023-07-31 DIAGNOSIS — Z79899 Other long term (current) drug therapy: Secondary | ICD-10-CM | POA: Diagnosis not present

## 2023-07-31 DIAGNOSIS — R0789 Other chest pain: Secondary | ICD-10-CM | POA: Diagnosis present

## 2023-07-31 DIAGNOSIS — Z7982 Long term (current) use of aspirin: Secondary | ICD-10-CM | POA: Insufficient documentation

## 2023-07-31 DIAGNOSIS — E039 Hypothyroidism, unspecified: Secondary | ICD-10-CM | POA: Diagnosis not present

## 2023-07-31 DIAGNOSIS — J45909 Unspecified asthma, uncomplicated: Secondary | ICD-10-CM | POA: Diagnosis not present

## 2023-07-31 LAB — COMPREHENSIVE METABOLIC PANEL WITH GFR
ALT: 15 U/L (ref 0–44)
AST: 23 U/L (ref 15–41)
Albumin: 4.3 g/dL (ref 3.5–5.0)
Alkaline Phosphatase: 69 U/L (ref 38–126)
Anion gap: 11 (ref 5–15)
BUN: 12 mg/dL (ref 6–20)
CO2: 25 mmol/L (ref 22–32)
Calcium: 9 mg/dL (ref 8.9–10.3)
Chloride: 102 mmol/L (ref 98–111)
Creatinine, Ser: 0.74 mg/dL (ref 0.44–1.00)
GFR, Estimated: >60 mL/min (ref >60)
Glucose, Bld: 108 mg/dL — ABNORMAL HIGH (ref 70–99)
Potassium: 4.1 mmol/L (ref 3.5–5.1)
Sodium: 137 mmol/L (ref 135–145)
Total Bilirubin: 0.3 mg/dL (ref 0.0–1.2)
Total Protein: 7.4 g/dL (ref 6.5–8.1)

## 2023-07-31 LAB — CBC WITH DIFFERENTIAL/PLATELET
Abs Immature Granulocytes: 0 10*3/uL (ref 0.00–0.07)
Basophils Absolute: 0 10*3/uL (ref 0.0–0.1)
Basophils Relative: 1 %
Eosinophils Absolute: 0.2 10*3/uL (ref 0.0–0.5)
Eosinophils Relative: 4 %
HCT: 34.5 % — ABNORMAL LOW (ref 36.0–46.0)
Hemoglobin: 11.2 g/dL — ABNORMAL LOW (ref 12.0–15.0)
Immature Granulocytes: 0 %
Lymphocytes Relative: 40 %
Lymphs Abs: 1.9 10*3/uL (ref 0.7–4.0)
MCH: 28.3 pg (ref 26.0–34.0)
MCHC: 32.5 g/dL (ref 30.0–36.0)
MCV: 87.1 fL (ref 80.0–100.0)
Monocytes Absolute: 0.4 10*3/uL (ref 0.1–1.0)
Monocytes Relative: 8 %
Neutro Abs: 2.2 10*3/uL (ref 1.7–7.7)
Neutrophils Relative %: 47 %
Platelets: 311 10*3/uL (ref 150–400)
RBC: 3.96 MIL/uL (ref 3.87–5.11)
RDW: 13.9 % (ref 11.5–15.5)
WBC: 4.7 10*3/uL (ref 4.0–10.5)
nRBC: 0 % (ref 0.0–0.2)

## 2023-07-31 LAB — LIPASE, BLOOD: Lipase: 29 U/L (ref 11–51)

## 2023-07-31 LAB — RESP PANEL BY RT-PCR (RSV, FLU A&B, COVID)  RVPGX2
Influenza A by PCR: NEGATIVE
Influenza B by PCR: NEGATIVE
Resp Syncytial Virus by PCR: NEGATIVE
SARS Coronavirus 2 by RT PCR: NEGATIVE

## 2023-07-31 LAB — TROPONIN T, HIGH SENSITIVITY: Troponin T High Sensitivity: <15 ng/L (ref <19)

## 2023-07-31 LAB — HCG, SERUM, QUALITATIVE: Preg, Serum: NEGATIVE

## 2023-07-31 LAB — D-DIMER, QUANTITATIVE: D-Dimer, Quant: <0.27 ug{FEU}/mL (ref 0.00–0.50)

## 2023-07-31 MED ORDER — KETOROLAC TROMETHAMINE 15 MG/ML IJ SOLN
15.0000 mg | Freq: Once | INTRAMUSCULAR | Status: AC
  Administered 2023-07-31: 15 mg via INTRAVENOUS
  Filled 2023-07-31: qty 1

## 2023-07-31 MED ORDER — PANTOPRAZOLE SODIUM 40 MG IV SOLR
40.0000 mg | Freq: Once | INTRAVENOUS | Status: AC
  Administered 2023-07-31: 40 mg via INTRAVENOUS
  Filled 2023-07-31: qty 10

## 2023-07-31 NOTE — ED Notes (Signed)
 ED Provider at bedside.

## 2023-07-31 NOTE — Discharge Instructions (Signed)
 As discussed, your labs and imaging are reassuring.  No signs of blood clot or heart attack causing your symptoms.  You can alternate between ibuprofen  600 mg and Tylenol  500 mg every 4 hours as needed for pain.  If the pantoprazole helps, you can get it over-the-counter for additional relief for GERD.  I have sent ambulatory referral for a cardiologist that should give you a call in neck several days to schedule an appointment for reevaluation.  Get help right away if: You have nausea or vomiting. You feel sweaty or light-headed. You have a cough with mucus from your lungs (sputum) or you cough up blood. You develop shortness of breath.

## 2023-07-31 NOTE — ED Provider Notes (Signed)
 [place] [facility] Provider Note   CSN: [id number] Arrival date & time: 07/31/23  1933     History  Chief Complaint  Patient presents with   Chest Pain    [name] [name] is a 38 y.o. female with a history of asthma, psoriatic arthritis, and hypothyroidism who presents the ED today for chest pain.  Patient endorses intermittent episodes of lower chest pain and shortness of breath for the past week.  She also endorses having a viral illness and does note that associated with that or associated with added stress she is having in her life right now.  Denies any nausea, vomiting, or diarrhea.  No recent hospitalizations, surgeries, or long travels.  No blood thinner use or history of coagulopathy.    Home Medications Prior to Admission medications   Medication Sig Start Date End Date Taking? Authorizing Provider  albuterol  (PROVENTIL  HFA;VENTOLIN  HFA) 108 (90 BASE) MCG/ACT inhaler Inhale 1 puff into the lungs every 6 (six) hours as needed for wheezing or shortness of breath.    [provider]  aspirin EC 81 MG tablet Take 81 mg by mouth as needed.    [provider]  aspirin EC 81 MG tablet     [provider]  cetirizine (ZYRTEC) 10 MG tablet     [provider]  METRONIDAZOLE  PO Take by mouth. Patient not taking: Reported on 12/26/2020    [provider]  ondansetron  (ZOFRAN ) 4 MG tablet Take 4 mg by mouth daily. 10/10/20   [provider]  Prenatal Vit-Fe Fumarate-FA (PRENATAL PO) Take by mouth.    [provider]  propylthiouracil (PTU) 50 MG tablet Take 50 mg by mouth 2 (two) times daily. 11/04/20   [provider]  vitamin B-12 (CYANOCOBALAMIN) 100 MCG tablet     [provider]      Allergies    Cats claw [uncaria tomentosa (cats claw)] and Mold extract [trichophyton]    Review of Systems   Review of Systems  Cardiovascular:  Positive for chest pain.  All  other systems reviewed and are negative.   Physical Exam Updated Vital Signs BP 98/71   Pulse 70   Temp 98.2 F (36.8 C) (Oral)   Resp 12   Ht 5\' 3"  (1.6 m)   Wt 61.7 kg   SpO2 100%   Breastfeeding No   BMI 24.09 kg/m  Physical Exam Vitals and nursing note reviewed.  Constitutional:      Appearance: Normal appearance.  HENT:     Head: Normocephalic and atraumatic.     Mouth/Throat:     Mouth: Mucous membranes are moist.  Eyes:     Conjunctiva/sclera: Conjunctivae normal.     Pupils: Pupils are equal, round, and reactive to light.  Cardiovascular:     Rate and Rhythm: Normal rate and regular rhythm.     Pulses: Normal pulses.     Heart sounds: Normal heart sounds.  Pulmonary:     Effort: Pulmonary effort is normal.     Breath sounds: Normal breath sounds.  Abdominal:     Palpations: Abdomen is soft.     Tenderness: There is no abdominal tenderness.  Musculoskeletal:        General: Normal range of motion.     Cervical back: Normal range of motion.  Skin:    General: Skin is warm and dry.     Findings: No rash.  Neurological:     General: No focal  deficit present.     Mental Status: She is alert.  Psychiatric:        Mood and Affect: Mood normal.        Behavior: Behavior normal.     ED Results / Procedures / Treatments   Labs (all labs ordered are listed, but only abnormal results are displayed) Labs Reviewed  CBC WITH DIFFERENTIAL/PLATELET - Abnormal; Notable for the following components:      Result Value   Hemoglobin 11.2 (*)    HCT 34.5 (*)    All other components within normal limits  COMPREHENSIVE METABOLIC PANEL WITH GFR - Abnormal; Notable for the following components:   Glucose, Bld 108 (*)    All other components within normal limits  RESP PANEL BY RT-PCR (RSV, FLU A&B, COVID)  RVPGX2  LIPASE, BLOOD  HCG, SERUM, QUALITATIVE  D-DIMER, QUANTITATIVE  TROPONIN T, HIGH SENSITIVITY    EKG None  Radiology DG Chest 2 View Result Date:  07/31/2023 CLINICAL DATA:  Chest pain, cough, congestion EXAM: CHEST - 2 VIEW COMPARISON:  Radiograph and CT 08/26/2017 FINDINGS: The heart size and mediastinal contours are within normal limits. Both lungs are clear. The visualized skeletal structures are unremarkable. IMPRESSION: No active cardiopulmonary disease. Electronically Signed   By: Rozell Cornet M.D.   On: 07/31/2023 20:20    Procedures Procedures    Medications Ordered in ED Medications  ketorolac  (TORADOL ) 15 MG/ML injection 15 mg (15 mg Intravenous Given 07/31/23 2116)  pantoprazole (PROTONIX) injection 40 mg (40 mg Intravenous Given 07/31/23 2225)    ED Course/ Medical Decision Making/ A&P                                 Medical Decision Making Amount and/or Complexity of Data Reviewed Labs: ordered. Radiology: ordered.  Risk Prescription drug management.   This patient presents to the ED for concern of chest pain and SOB, this involves an extensive number of treatment options, and is a complaint that carries with it a high risk of complications and morbidity.   Differential diagnosis includes: ACS, PE, pneumonia, pneumothorax, costochondritis, pleurisy, anxiety, GERD, etc.   Comorbidities  See HPI above   Additional History  Additional history obtained from prior records.   Cardiac Monitoring / EKG  The patient was maintained on a cardiac monitor.  I personally viewed and interpreted the cardiac monitored which showed: NSR with a heart rate of 77 bpm.   Lab Tests  I ordered and personally interpreted labs.  The pertinent results include:   Negative troponin Negative D-dimer Negative pregnancy test CMP, lipase, and CBC are reassuring   Imaging Studies  I ordered imaging studies including CXR  I independently visualized and interpreted imaging which showed:  No acute cardiopulmonary disease I agree with the radiologist interpretation   Problem List / ED Course / Critical Interventions /  Medication Management  Patient reports intermittent episodes of left sided chest, shoulder and back pain for the past week with shortness of breath.  States that she also has a cold right now and does not know if that is related.  Extensive family history of heart attacks in the past and wants to make sure there is nothing going on with her heart at this time.  Does not know if her anxiety can play a role or her asthma. I ordered medications including: Toradol  for pain  Reevaluation of the patient after these medicines showed that  the patient improved some.  Protonix given prior to discharge to see if pain was GERD related. I have reviewed the patients home medicines and have made adjustments as needed   Social Determinants of Health  Access to healthcare   Test / Admission - Considered  Discussed findings with patient.  All questions were answered. She is stable and safe for discharge home. Return precautions given.       Final Clinical Impression(s) / ED Diagnoses Final diagnoses:  Atypical chest pain    Rx / DC Orders ED Discharge Orders          Ordered    Ambulatory referral to Cardiology       Comments: If you have not heard from the Cardiology office within the next 72 hours please call [phone].   07/31/23 2221              Sonnie Dusky, PA-C 07/31/23 2229    Rolinda Climes, DO 07/31/23 2354

## 2023-07-31 NOTE — ED Notes (Signed)
 Seen in triage by this RT. Clear BLB, no distress noted

## 2023-07-31 NOTE — ED Triage Notes (Signed)
 Pt c/o epigastric discomfort that radiates around LT side ribs and into back for about 1 wk; +cough, using inhaler more than usual

## 2024-01-26 ENCOUNTER — Ambulatory Visit: Admitting: Cardiology

## 2024-02-22 ENCOUNTER — Ambulatory Visit: Attending: Cardiology | Admitting: Cardiology

## 2024-02-22 ENCOUNTER — Encounter: Payer: Self-pay | Admitting: Cardiology

## 2024-02-22 VITALS — BP 110/70 | HR 100 | Ht 64.0 in | Wt 134.2 lb

## 2024-02-22 DIAGNOSIS — E7800 Pure hypercholesterolemia, unspecified: Secondary | ICD-10-CM

## 2024-02-22 DIAGNOSIS — Z8249 Family history of ischemic heart disease and other diseases of the circulatory system: Secondary | ICD-10-CM

## 2024-02-22 DIAGNOSIS — E079 Disorder of thyroid, unspecified: Secondary | ICD-10-CM

## 2024-02-22 DIAGNOSIS — R0602 Shortness of breath: Secondary | ICD-10-CM

## 2024-02-22 DIAGNOSIS — R072 Precordial pain: Secondary | ICD-10-CM

## 2024-02-22 NOTE — Patient Instructions (Signed)
 Medication Instructions:  Your physician recommends that you continue on your current medications as directed. Please refer to the Current Medication list given to you today.  *If you need a refill on your cardiac medications before your next appointment, please call your pharmacy*  Lab Work: None ordered If you have labs (blood work) drawn today and your tests are completely normal, you will receive your results only by: MyChart Message (if you have MyChart) OR A paper copy in the mail If you have any lab test that is abnormal or we need to change your treatment, we will call you to review the results.  Testing/Procedures: Echocardiogram  Exercise Tolerance Test  Follow-Up: At [facility], you and your health needs are our priority.  As part of our continuing mission to provide you with exceptional heart care, our providers are all part of one team.  This team includes your primary Cardiologist (physician) and Advanced Practice Providers or APPs (Physician Assistants and Nurse Practitioners) who all work together to provide you with the care you need, when you need it.  Your next appointment:   2 year(s)  Provider:   Madonna Large, DO    We recommend signing up for the patient portal called MyChart.  Sign up information is provided on this After Visit Summary.  MyChart is used to connect with patients for Virtual Visits (Telemedicine).  Patients are able to view lab/test results, encounter notes, upcoming appointments, etc.  Non-urgent messages can be sent to your provider as well.   To learn more about what you can do with MyChart, go to [email or web address].   Other Instructions Your physician has requested that you have an echocardiogram. Echocardiography is a painless test that uses sound waves to create images of your heart. It provides your doctor with information about the size and shape of your heart and how well your hearts chambers and valves are working. This  procedure takes approximately one hour. There are no restrictions for this procedure. Please do NOT wear cologne, perfume, aftershave, or lotions (deodorant is allowed). Please arrive 15 minutes prior to your appointment time.  Please note: We ask at that you not bring children with you during ultrasound (echo/ vascular) testing. Due to room size and safety concerns, children are not allowed in the ultrasound rooms during exams. Our front office staff cannot provide observation of children in our lobby area while testing is being conducted. An adult accompanying a patient to their appointment will only be allowed in the ultrasound room at the discretion of the ultrasound technician under special circumstances. We apologize for any inconvenience.   - DO NOT EAT, DRINK, OR USE TOBACCO PRODUCTS WITHIN 4 HOURS OF THE TEST - DRESS PREPARED TO EXERCISE IN A COMFORTABLE, 2 PIECE CLOTHING OUTFIT AND WALKING SHOES - BRING ANY PRESCRIPTION MEDICATIONS WITH YOU  - NOTIFY THE OFFICE 24 HOURS IN ADVANCE IF YOU NEED TO CANCEL - CALL THE OFFICE [phone] IF YOU HAVE ANY QUESTIONS

## 2024-02-22 NOTE — Progress Notes (Signed)
 Cardiology Office Note:    Date:  02/22/2024  NAME:  [name] [name]    MRN: [id number] DOB:  Sep 06, 1985   PCP:  Brien Josette HERO, PA-C (Inactive)  Former Cardiology Providers: Loni Soyla LABOR, MD  Primary Cardiologist:  Madonna Large, DO, [facility] (established care 02/22/2024) Electrophysiologist:  None   Referring MD: Mickie Kaufman, MD  Reason of Consult: Shortness of breath  Chief Complaint  Patient presents with   Shortness of Breath   New Patient (Initial Visit)   Chest Pain    History of Present Illness:    [name] [name] is a 38 y.o. Caucasian female whose past medical history and cardiovascular risk factors includes: history of gestational diabetes, thyroid  disease, anxiety, asthma, depression, psoriatic arthritis and Graves disease, family history of heart disease/MI, history of COVID infections.   She is being seen today for the evaluation of shortness of breath at the request of Mickie Kaufman, MD. [name] [name] under the care of Loni Soyla LABOR, MD  who last saw [name] [name] back in 12/2020. I am seeing her for the first time to re-establishing care.   Shortness of breath: Recent onset. More noticeable with effort related activities. Denies orthopnea, PND, or lower extremity swelling Improves with resting and relaxing. Intermittent.  Ches discomofrt  Occurs randomly. Ongoing for the last 1 year.   No change in intensity frequency or duration. Intermittent. Describes it as a heartburn-like sensation over the left anterior precordium. Intensity 6 out of 10. Not brought on by effort related activities. Does not improve with resting. Carrara-limited   Other contributing factors include tachycardia likely secondary to underlying hyperthyroidism.  Patient states that her thyroid  function has been variable throughout the year.  Initially she was on methimazole later discontinued and now restarted she has noted elevated heart rate at baseline and uses  propranolol on as needed basis   She also has had increased stress in life secondary to marriage issues and patient and husband living separately, dad recently had coronary interventions to his LAD, she is a mother of 2 kids, and also works.  Patient is very reasonable and understands that the symptoms could be noncardiac; however, given her family history she is concerned and given the chronicity she wanted to be reevaluated.  Grandmother had CABG x3, Alzheimer's, and died of heart attack.  Paternal Uncle had CABG x4 in his 26s.  Great grandfather died of heart attack at an early age   Current Medications: Active Medications[1]   Allergies:    Cats claw [uncaria tomentosa (cats claw)] and Mold extract [trichophyton]   Past Medical History: Past Medical History:  Diagnosis Date   Anxiety    Asthma    Depression 2016   after daughter was stillborn (08/24/2017)   Gestational diabetes 2018   Graves disease    Hypertension 2016   during pregnancy only (08/24/2017)   Hypothyroidism    after 2nd pregnancy, resolved and no longer requires medication. OB checks TSH regularly.    Postpartum depression 2018   Psoriatic arthritis [facility])     Past Surgical History: Past Surgical History:  Procedure Laterality Date   APPENDECTOMY  08/24/2017   CESAREAN SECTION N/A 09/13/2016   Procedure: CESAREAN SECTION;  Surgeon: Danielle Rom, MD;  Location: [facility];  Service: Obstetrics;  Laterality: N/A;   LAPAROSCOPIC APPENDECTOMY N/A 08/24/2017   Procedure: APPENDECTOMY LAPAROSCOPIC;  Surgeon: Vanderbilt Ned, MD;  Location: [facility] OR;  Service: General;  Laterality: N/A;   WISDOM TOOTH EXTRACTION  2004    Social History: Social History[2]  Family History: Family History  Problem Relation Age of Onset   Cancer Maternal Grandmother    Heart disease Maternal Grandmother    Dementia Maternal Grandmother    Depression Mother    Hypothyroidism Mother    Memory loss Mother         forgetful   Anxiety disorder Father    Depression Father    Hyperlipidemia Father    Dementia Maternal Grandfather    Dementia Paternal Grandmother        diagnosed early 29s   Diabetes Paternal Grandmother    CAD Paternal Grandmother     ROS:   Review of Systems  Cardiovascular:  Positive for chest pain. Negative for claudication, irregular heartbeat, leg swelling, near-syncope, orthopnea, palpitations, paroxysmal nocturnal dyspnea and syncope.  Respiratory:  Positive for shortness of breath.   Hematologic/Lymphatic: Negative for bleeding problem.  Musculoskeletal:  Positive for back pain.    Studies Reviewed:   EKG: EKG Interpretation Date/Time:  Tuesday February 22 2024 09:56:36 EST Ventricular Rate:  103 PR Interval:  132 QRS Duration:  82 QT Interval:  312 QTC Calculation: 408 R Axis:   79  Text Interpretation: Sinus tachycardia When compared with ECG of 31-Jul-2023 19:42, No significant change was found Confirmed by Michele Richardson [phone]) on 02/22/2024 10:23:24 AM  Echocardiogram: 02/27/2021  1. Left ventricular ejection fraction, by estimation, is 55 to 60%. Left  ventricular ejection fraction by 3D volume is 57 %. The left ventricle has  normal function. The left ventricle has no regional wall motion  abnormalities. Left ventricular diastolic   parameters were normal. The average left ventricular global longitudinal  strain is -22.1 %. The global longitudinal strain is normal.   2. Right ventricular systolic function is normal. The right ventricular  size is normal.   3. The mitral valve is normal in structure. No evidence of mitral valve  regurgitation. No evidence of mitral stenosis.   4. The aortic valve is tricuspid. Aortic valve regurgitation is not  visualized. No aortic stenosis is present.   5. The inferior vena cava is normal in size with greater than 50%  respiratory variability, suggesting right atrial pressure of 3 mmHg.    Labs:    Latest Ref  Rng & Units 07/31/2023    8:17 PM 06/10/2020   11:22 AM 02/22/2019    6:09 AM  CBC  WBC 4.0 - 10.5 K/uL 4.7  4.8  10.6   Hemoglobin 12.0 - 15.0 g/dL 88.7  86.1  88.7   Hematocrit 36.0 - 46.0 % 34.5  41.9  33.4   Platelets 150 - 400 K/uL 311  296  172        Latest Ref Rng & Units 07/31/2023    8:17 PM 06/10/2020   11:22 AM 02/21/2019    8:16 AM  BMP  Glucose 70 - 99 mg/dL 891  95  88   BUN 6 - 20 mg/dL 12  12  6    Creatinine 0.44 - 1.00 mg/dL 9.25  9.28  9.40   BUN/Creat Ratio 9 - 23  17    Sodium 135 - 145 mmol/L 137  141  136   Potassium 3.5 - 5.1 mmol/L 4.1  4.8  3.8   Chloride 98 - 111 mmol/L 102  104  106   CO2 22 - 32 mmol/L 25  23  22    Calcium 8.9 - 10.3 mg/dL 9.0  9.6  8.4  Latest Ref Rng & Units 07/31/2023    8:17 PM 06/10/2020   11:22 AM 02/21/2019    8:16 AM  CMP  Glucose 70 - 99 mg/dL 891  95  88   BUN 6 - 20 mg/dL 12  12  6    Creatinine 0.44 - 1.00 mg/dL 9.25  9.28  9.40   Sodium 135 - 145 mmol/L 137  141  136   Potassium 3.5 - 5.1 mmol/L 4.1  4.8  3.8   Chloride 98 - 111 mmol/L 102  104  106   CO2 22 - 32 mmol/L 25  23  22    Calcium 8.9 - 10.3 mg/dL 9.0  9.6  8.4   Total Protein 6.5 - 8.1 g/dL 7.4  7.4  6.0   Total Bilirubin 0.0 - 1.2 mg/dL 0.3  0.4  0.5   Alkaline Phos 38 - 126 U/L 69  116  166   AST 15 - 41 U/L 23  19  21    ALT 0 - 44 U/L 15  19  17      No results found for: CHOL, HDL, LDLCALC, LDLDIRECT, TRIG, CHOLHDL No results for input(s): LIPOA in the last 8760 hours. No components found for: NTPROBNP No results for input(s): PROBNP in the last 8760 hours. No results for input(s): TSH in the last 8760 hours.  Physical Exam:    Today's Vitals   02/22/24 0958  BP: 110/70  Pulse: 100  SpO2: 97%  Weight: 134 lb 3.2 oz (60.9 kg)  Height: 5' 4 (1.626 m)   Body mass index is 23.04 kg/m. Wt Readings from Last 3 Encounters:  02/22/24 134 lb 3.2 oz (60.9 kg)  07/31/23 136 lb (61.7 kg)  12/26/20 144 lb 6.4 oz (65.5 kg)     Physical Exam  Constitutional: No distress.  hemodynamically stable  Neck: No JVD present.  Cardiovascular: Normal rate, regular rhythm, S1 normal and S2 normal. Exam reveals no gallop, no S3 and no S4.  No murmur heard. Pulmonary/Chest: Effort normal and breath sounds normal. No stridor. She has no wheezes. She has no rales.  Musculoskeletal:        General: No edema.     Cervical back: Neck supple.  Skin: Skin is warm.     Impression & Recommendation(s):  Impression:   ICD-10-CM   1. Precordial pain  R07.2 Exercise Tolerance Test    ECHOCARDIOGRAM COMPLETE    Cardiac Stress Test: Informed Consent Details: Physician/Practitioner Attestation; Transcribe to consent form and obtain patient signature    2. Shortness of breath  R06.02 EKG 12-Lead    Exercise Tolerance Test    ECHOCARDIOGRAM COMPLETE    Cardiac Stress Test: Informed Consent Details: Physician/Practitioner Attestation; Transcribe to consent form and obtain patient signature    3. Hypercholesteremia  E78.00     4. Family history of heart disease  Z82.49     5. Thyroid  disease  E07.9        Recommendation(s):  Precordial pain Shortness of breath Symptoms are noncardiac EKG nonischemic Probably precipitated by underlying thyroid  disease, tachycardia, increased stress Lipids have improved when compared to prior Family history of heart disease, not premature Due to continued and chronic discomfort she presents to cardiology for further evaluation and management Echo will be ordered to evaluate for structural heart disease and left ventricular systolic function. Exercise treadmill stress test Patient is advised not to use propranolol 48 hours prior to her GXT I would favor that her thyroid  disease is better managed  prior to stress test.  She is tachycardic at baseline. We also discussed considering coronary artery calcium scoring for further risk stratification given her family history; however, since her  young age shared decision was to hold off for now  Hypercholesteremia Improved when compared to prior levels. Continue lifestyle modifications.  Thyroid  disease Currently on methimazole   Orders Placed:  Orders Placed This Encounter  Procedures   Cardiac Stress Test: Informed Consent Details: Physician/Practitioner Attestation; Transcribe to consent form and obtain patient signature    Physician/Practitioner attestation of informed consent for procedure/surgical case:   I, the physician/practitioner, attest that I have discussed with the patient the benefits, risks, side effects, alternatives, likelihood of achieving goals and potential problems during recovery for the procedure that I have provided informed consent.    Procedure:   Exercise Tolerance Test    Indication/Reason:   Precodial Pain   Exercise Tolerance Test    Standing Status:   Future    Expected Date:   02/29/2024    Expiration Date:   02/21/2025    Where [facility] this test [facility] performed[facility]    Stress with pharmacologic or treadmill ?:   Treadmill w/ exercise   EKG 12-Lead   ECHOCARDIOGRAM COMPLETE    Standing Status:   Future    Expected Date:   02/29/2024    Expiration Date:   02/21/2025    Where should this test be performed:   [facility]    Does the patient weigh less than or greater than 250 lbs?:   Patient weighs less than 250 lbs    Perflutren DEFINITY (image enhancing agent) should be administered unless hypersensitivity or allergy exist:   Administer Perflutren    Reason for exam-Echo:   Chest Pain  R07.9     Final Medication List:   No orders of the defined types were placed in this encounter.   Medications Discontinued During This Encounter  Medication Reason   aspirin EC 81 MG tablet Patient Preference   aspirin EC 81 MG tablet Patient Preference   propylthiouracil (PTU) 50 MG tablet Change in therapy   vitamin B-12 (CYANOCOBALAMIN) 100 MCG tablet Patient Preference    methimazole (TAPAZOLE) 10 MG tablet Dose change    Current Medications[3]  Consent:   Informed Consent   Shared Decision Making/Informed Consent The risks [chest pain, shortness of breath, cardiac arrhythmias, dizziness, blood pressure fluctuations, myocardial infarction, stroke/transient ischemic attack, and life-threatening complications (estimated to be 1 in 10,000)], benefits (risk stratification, diagnosing coronary artery disease, treatment guidance) and alternatives of an exercise tolerance test were discussed in detail with Ms. [name] and she agrees to proceed.     Disposition:   As needed. However, given her family history patient would like to be seen if needed. Recommend 2-year follow-up or as needed  Signed, Madonna Large, DO, [facility]  A Division of  [facility] [address], [place], [place] [address]      [1]  Current Meds  Medication Sig   albuterol  (PROVENTIL  HFA;VENTOLIN  HFA) 108 (90 BASE) MCG/ACT inhaler Inhale 1 puff into the lungs every 6 (six) hours as needed for wheezing or shortness of breath.   cetirizine (ZYRTEC) 10 MG tablet    methimazole (TAPAZOLE) 5 MG tablet Take 15 mg by mouth daily.   METRONIDAZOLE  PO Take by mouth.   ondansetron  (ZOFRAN ) 4 MG tablet Take 4 mg by mouth daily.   Prenatal Vit-Fe Fumarate-FA (PRENATAL  PO) Take by mouth.   propranolol (INDERAL) 10 MG tablet Take 10 mg by mouth as needed.   [DISCONTINUED] aspirin EC 81 MG tablet Take 81 mg by mouth as needed.   [DISCONTINUED] aspirin EC 81 MG tablet    [DISCONTINUED] methimazole (TAPAZOLE) 10 MG tablet Take 10 mg by mouth daily.   [DISCONTINUED] propylthiouracil (PTU) 50 MG tablet Take 50 mg by mouth 2 (two) times daily.   [DISCONTINUED] vitamin B-12 (CYANOCOBALAMIN) 100 MCG tablet   [2]  Social History Tobacco Use   Smoking status: Never   Smokeless tobacco: Never  Vaping Use   Vaping status: Never Used  Substance Use Topics   Alcohol use: Not  Currently    Alcohol/week: 3.0 standard drinks of alcohol    Types: 3 Glasses of wine per week   Drug use: Never  [3]  Current Outpatient Medications:    albuterol  (PROVENTIL  HFA;VENTOLIN  HFA) 108 (90 BASE) MCG/ACT inhaler, Inhale 1 puff into the lungs every 6 (six) hours as needed for wheezing or shortness of breath., Disp: , Rfl:    cetirizine (ZYRTEC) 10 MG tablet, , Disp: , Rfl:    methimazole (TAPAZOLE) 5 MG tablet, Take 15 mg by mouth daily., Disp: , Rfl:    METRONIDAZOLE  PO, Take by mouth., Disp: , Rfl:    ondansetron  (ZOFRAN ) 4 MG tablet, Take 4 mg by mouth daily., Disp: , Rfl:    Prenatal Vit-Fe Fumarate-FA (PRENATAL PO), Take by mouth., Disp: , Rfl:    propranolol (INDERAL) 10 MG tablet, Take 10 mg by mouth as needed., Disp: , Rfl:

## 2024-03-21 ENCOUNTER — Telehealth (HOSPITAL_COMMUNITY): Payer: Self-pay

## 2024-03-21 NOTE — Telephone Encounter (Signed)
 Spoke to the patient, instructions given. S.Laderrick Wilk CCT

## 2024-03-28 ENCOUNTER — Ambulatory Visit (HOSPITAL_COMMUNITY)
Admission: RE | Admit: 2024-03-28 | Discharge: 2024-03-28 | Disposition: A | Discharge status: Home / Self Care | Source: Ambulatory Visit | Attending: Cardiology | Admitting: Cardiology

## 2024-03-28 DIAGNOSIS — R072 Precordial pain: Secondary | ICD-10-CM | POA: Diagnosis not present

## 2024-03-28 DIAGNOSIS — R0602 Shortness of breath: Secondary | ICD-10-CM | POA: Diagnosis not present

## 2024-03-29 ENCOUNTER — Ambulatory Visit: Payer: Self-pay | Admitting: Cardiology

## 2024-03-29 LAB — EXERCISE TOLERANCE TEST
Angina Index: 0
Duke Treadmill Score: 10
Estimated workload: 11.3
Exercise duration (min): 9 min
Exercise duration (sec): 45 s
Peak HR: 190 {beats}/min
Percent HR: 104 %
Rest HR: 107 {beats}/min
ST Depression (mm): 0 mm

## 2024-04-03 ENCOUNTER — Ambulatory Visit (HOSPITAL_COMMUNITY)

## 2024-05-04 ENCOUNTER — Ambulatory Visit (HOSPITAL_COMMUNITY)
